# Patient Record
Sex: Female | Born: 1940 | Race: Black or African American | Hispanic: No | Smoking: Former smoker
Health system: Southern US, Community
[De-identification: ages and names within clinical notes are randomized; demographics above are authoritative.]

## PROBLEM LIST (undated history)

## (undated) DIAGNOSIS — I10 Essential (primary) hypertension: Secondary | ICD-10-CM

## (undated) DIAGNOSIS — B019 Varicella without complication: Secondary | ICD-10-CM

## (undated) DIAGNOSIS — F0390 Unspecified dementia without behavioral disturbance: Secondary | ICD-10-CM

## (undated) DIAGNOSIS — R4701 Aphasia: Secondary | ICD-10-CM

## (undated) DIAGNOSIS — R251 Tremor, unspecified: Secondary | ICD-10-CM

## (undated) DIAGNOSIS — M109 Gout, unspecified: Secondary | ICD-10-CM

## (undated) DIAGNOSIS — K219 Gastro-esophageal reflux disease without esophagitis: Secondary | ICD-10-CM

## (undated) DIAGNOSIS — D649 Anemia, unspecified: Secondary | ICD-10-CM

## (undated) DIAGNOSIS — E119 Type 2 diabetes mellitus without complications: Secondary | ICD-10-CM

## (undated) DIAGNOSIS — N184 Chronic kidney disease, stage 4 (severe): Secondary | ICD-10-CM

## (undated) DIAGNOSIS — Z9289 Personal history of other medical treatment: Secondary | ICD-10-CM

## (undated) DIAGNOSIS — E7800 Pure hypercholesterolemia, unspecified: Secondary | ICD-10-CM

## (undated) DIAGNOSIS — M1990 Unspecified osteoarthritis, unspecified site: Secondary | ICD-10-CM

## (undated) HISTORY — PX: CHOLECYSTECTOMY: SHX55

## (undated) HISTORY — PX: APPENDECTOMY: SHX54

## (undated) HISTORY — DX: Varicella without complication: B01.9

---

## 2014-01-31 ENCOUNTER — Emergency Department (HOSPITAL_BASED_OUTPATIENT_CLINIC_OR_DEPARTMENT_OTHER): Payer: Medicare Other

## 2014-01-31 ENCOUNTER — Emergency Department (HOSPITAL_BASED_OUTPATIENT_CLINIC_OR_DEPARTMENT_OTHER)
Admission: EM | Admit: 2014-01-31 | Discharge: 2014-01-31 | Disposition: A | Discharge status: Home / Self Care | Payer: Medicare Other | Attending: Emergency Medicine | Admitting: Emergency Medicine

## 2014-01-31 ENCOUNTER — Encounter (HOSPITAL_BASED_OUTPATIENT_CLINIC_OR_DEPARTMENT_OTHER): Payer: Self-pay | Admitting: Emergency Medicine

## 2014-01-31 DIAGNOSIS — L02619 Cutaneous abscess of unspecified foot: Secondary | ICD-10-CM | POA: Insufficient documentation

## 2014-01-31 DIAGNOSIS — N184 Chronic kidney disease, stage 4 (severe): Secondary | ICD-10-CM | POA: Insufficient documentation

## 2014-01-31 DIAGNOSIS — W19XXXA Unspecified fall, initial encounter: Secondary | ICD-10-CM

## 2014-01-31 DIAGNOSIS — N289 Disorder of kidney and ureter, unspecified: Secondary | ICD-10-CM

## 2014-01-31 DIAGNOSIS — Z794 Long term (current) use of insulin: Secondary | ICD-10-CM | POA: Insufficient documentation

## 2014-01-31 DIAGNOSIS — L03119 Cellulitis of unspecified part of limb: Principal | ICD-10-CM

## 2014-01-31 DIAGNOSIS — E119 Type 2 diabetes mellitus without complications: Secondary | ICD-10-CM | POA: Insufficient documentation

## 2014-01-31 DIAGNOSIS — S0990XA Unspecified injury of head, initial encounter: Secondary | ICD-10-CM | POA: Insufficient documentation

## 2014-01-31 DIAGNOSIS — W1809XA Striking against other object with subsequent fall, initial encounter: Secondary | ICD-10-CM | POA: Insufficient documentation

## 2014-01-31 DIAGNOSIS — Z87891 Personal history of nicotine dependence: Secondary | ICD-10-CM | POA: Insufficient documentation

## 2014-01-31 DIAGNOSIS — Z79899 Other long term (current) drug therapy: Secondary | ICD-10-CM | POA: Insufficient documentation

## 2014-01-31 DIAGNOSIS — Y929 Unspecified place or not applicable: Secondary | ICD-10-CM | POA: Insufficient documentation

## 2014-01-31 DIAGNOSIS — Y939 Activity, unspecified: Secondary | ICD-10-CM | POA: Insufficient documentation

## 2014-01-31 DIAGNOSIS — I129 Hypertensive chronic kidney disease with stage 1 through stage 4 chronic kidney disease, or unspecified chronic kidney disease: Secondary | ICD-10-CM | POA: Insufficient documentation

## 2014-01-31 DIAGNOSIS — L03115 Cellulitis of right lower limb: Secondary | ICD-10-CM

## 2014-01-31 HISTORY — DX: Tremor, unspecified: R25.1

## 2014-01-31 HISTORY — DX: Gout, unspecified: M10.9

## 2014-01-31 HISTORY — DX: Essential (primary) hypertension: I10

## 2014-01-31 LAB — COMPREHENSIVE METABOLIC PANEL
ALT: 75 U/L — AB (ref 0–35)
AST: 35 U/L (ref 0–37)
Albumin: 3.6 g/dL (ref 3.5–5.2)
Alkaline Phosphatase: 152 U/L — ABNORMAL HIGH (ref 39–117)
BUN: 29 mg/dL — ABNORMAL HIGH (ref 6–23)
CALCIUM: 10.9 mg/dL — AB (ref 8.4–10.5)
CO2: 26 mEq/L (ref 19–32)
CREATININE: 1.6 mg/dL — AB (ref 0.50–1.10)
Chloride: 98 mEq/L (ref 96–112)
GFR calc Af Amer: 36 mL/min — ABNORMAL LOW (ref >90)
GFR calc non Af Amer: 31 mL/min — ABNORMAL LOW (ref >90)
Glucose, Bld: 154 mg/dL — ABNORMAL HIGH (ref 70–99)
Potassium: 3.9 mEq/L (ref 3.7–5.3)
SODIUM: 137 meq/L (ref 137–147)
Total Bilirubin: 0.6 mg/dL (ref 0.3–1.2)
Total Protein: 8.5 g/dL — ABNORMAL HIGH (ref 6.0–8.3)

## 2014-01-31 LAB — CBC
HCT: 31.2 % — ABNORMAL LOW (ref 36.0–46.0)
Hemoglobin: 10.1 g/dL — ABNORMAL LOW (ref 12.0–15.0)
MCH: 30.3 pg (ref 26.0–34.0)
MCHC: 32.4 g/dL (ref 30.0–36.0)
MCV: 93.7 fL (ref 78.0–100.0)
Platelets: 237 10*3/uL (ref 150–400)
RBC: 3.33 MIL/uL — ABNORMAL LOW (ref 3.87–5.11)
RDW: 13.1 % (ref 11.5–15.5)
WBC: 12.9 10*3/uL — AB (ref 4.0–10.5)

## 2014-01-31 LAB — URINE MICROSCOPIC-ADD ON

## 2014-01-31 LAB — GLUCOSE, CAPILLARY: GLUCOSE-CAPILLARY: 160 mg/dL — AB (ref 70–99)

## 2014-01-31 LAB — URINALYSIS, ROUTINE W REFLEX MICROSCOPIC
Bilirubin Urine: NEGATIVE
GLUCOSE, UA: NEGATIVE mg/dL
Ketones, ur: NEGATIVE mg/dL
Nitrite: NEGATIVE
Protein, ur: 100 mg/dL — AB
Specific Gravity, Urine: 1.011 (ref 1.005–1.030)
Urobilinogen, UA: 1 mg/dL (ref 0.0–1.0)
pH: 7 (ref 5.0–8.0)

## 2014-01-31 MED ORDER — CLINDAMYCIN PHOSPHATE 600 MG/50ML IV SOLN
600.0000 mg | Freq: Once | INTRAVENOUS | Status: AC
  Administered 2014-01-31: 600 mg via INTRAVENOUS
  Filled 2014-01-31: qty 50

## 2014-01-31 MED ORDER — OXYCODONE-ACETAMINOPHEN 5-325 MG PO TABS: 1.0000 | ORAL_TABLET | Freq: Four times a day (QID) | ORAL | Status: DC | PRN

## 2014-01-31 MED ORDER — CLINDAMYCIN HCL 300 MG PO CAPS: 300.0000 mg | ORAL_CAPSULE | Freq: Four times a day (QID) | ORAL | Status: DC

## 2014-01-31 MED ORDER — MORPHINE SULFATE 4 MG/ML IJ SOLN
4.0000 mg | Freq: Once | INTRAMUSCULAR | Status: AC
  Administered 2014-01-31: 4 mg via INTRAVENOUS
  Filled 2014-01-31: qty 1

## 2014-01-31 MED ORDER — ONDANSETRON HCL 4 MG/2ML IJ SOLN
4.0000 mg | Freq: Once | INTRAMUSCULAR | Status: AC
  Administered 2014-01-31: 4 mg via INTRAVENOUS
  Filled 2014-01-31: qty 2

## 2014-01-31 NOTE — ED Notes (Addendum)
Swelling to bilat feet and legs x 6 days-also c/o of fall approx 2 weeks ago and concerned b/c she has HA-report from son and daughter in law-[facility] stay with family

## 2014-01-31 NOTE — ED Notes (Signed)
MD at bedside discussing test results and dispo plan of care. 

## 2014-01-31 NOTE — Discharge Instructions (Signed)
Return to the ED with any concerns including fever/chills, increased area of redness or swelling, vomiting and not able to keep down antibiotics, difficulty breathing, fainting, decreased level of alertness/lethargy, or any other alarming symptoms

## 2014-01-31 NOTE — ED Provider Notes (Signed)
CSN: [id number]     Arrival date & time 01/31/14  1523 History   First MD Initiated Contact with Patient 01/31/14 1529     Chief Complaint  Patient presents with  . Leg Swelling     (Consider location/radiation/quality/duration/timing/severity/associated sxs/prior Treatment) HPI Pt presents with multiple complaints.  Per family they have just come from [place] after picking her up to come live with them.  She had a fall approx 1 week ago, hit her head and is c/o diffuse headache.  Also c/o pain in left wrist and her anterior chest wall after the fall.  Over the past 3-4 days she has had swelling and redness of her right foot and lower leg.  Family states she was in a rehab facility in [place] and they were told she had stage 4 kidney disease.  They do state that since their arrival in [place] the swelling and redness in right leg only have been worsening.  No fever/chills, no vomiting.  No chest pain or shortness of breath.  Pt endorses feeling generally weak.  There are no other associated systemic symptoms, there are no other alleviating or modifying factors.   Past Medical History  Diagnosis Date  . Diabetes mellitus without complication   . Renal failure, chronic   . Gout   . Hypertension   . Tremors of nervous system    Past Surgical History  Procedure Laterality Date  . Cholecystectomy    . Appendectomy     No family history on file. History  Substance Use Topics  . Smoking status: Former Games developer  . Smokeless tobacco: Not on file  . Alcohol Use: Yes   OB History   Grav Para Term Preterm Abortions TAB SAB Ect Mult Living                 Review of Systems ROS reviewed and all otherwise negative except for mentioned in HPI    Allergies  Review of patient's allergies indicates no known allergies.  Home Medications   Current Outpatient Rx  Name  Route  Sig  Dispense  Refill  . amLODipine (NORVASC) 5 MG tablet   Oral   Take 5 mg by mouth daily.         . clindamycin  (CLEOCIN) 300 MG capsule   Oral   Take 1 capsule (300 mg total) by mouth every 6 (six) hours.   42 capsule   0   . enoxaparin (LOVENOX) 40 MG/0.4ML injection   Subcutaneous   Inject 40 mg into the skin daily.         . ferrous sulfate 325 (65 FE) MG tablet   Oral   Take 325 mg by mouth 2 (two) times daily with a meal.         . HYDROcodone-acetaminophen (NORCO/VICODIN) 5-325 MG per tablet   Oral   Take 1 tablet by mouth every 6 (six) hours as needed for moderate pain.         Marland Kitchen insulin detemir (LEVEMIR) 100 UNIT/ML injection   Subcutaneous   Inject 25 Units into the skin at bedtime.         . meclizine (ANTIVERT) 25 MG tablet   Oral   Take 25 mg by mouth 3 (three) times daily as needed for dizziness.         Marland Kitchen omeprazole (PRILOSEC) 40 MG capsule   Oral   Take 40 mg by mouth daily.         Marland Kitchen oxyCODONE-acetaminophen (PERCOCET/ROXICET)  5-325 MG per tablet   Oral   Take 1-2 tablets by mouth every 6 (six) hours as needed for severe pain.   15 tablet   0   . pioglitazone (ACTOS) 45 MG tablet   Oral   Take 45 mg by mouth daily.         . primidone (MYSOLINE) 50 MG tablet   Oral   Take 25 mg by mouth at bedtime.         . simvastatin (ZOCOR) 40 MG tablet   Oral   Take 40 mg by mouth daily.         . sodium bicarbonate 650 MG tablet   Oral   Take 650 mg by mouth 2 (two) times daily.          BP 173/69  Pulse 79  Temp(Src) 99 F (37.2 C) (Oral)  Resp 16  SpO2 93% Vitals reviewed Physical Exam Physical Examination: General appearance - alert, well appearing, and in no distress Mental status - alert, oriented to person, place, and time Head- NCAT Eyes - pupils equal and reactive, extraocular eye movements intact Mouth - mucous membranes moist, pharynx normal without lesions Chest - clear to auscultation, no wheezes, rales or rhonchi, symmetric air entry, ttp over lateral ribs bilaterally, no crepitus Heart - normal rate, regular rhythm,  normal S1, S2, no murmurs, rubs, clicks or gallops Abdomen - soft, nontender, nondistended, no masses or organomegaly Neurological - alert, oriented, normal speech, strength 5/5 in extremities x 4, sensation intact MS- ttp over left wrist, no pain with ROM of other extremities/joints Extremities - peripheral pulses normal, no clubbing or cyanosis, right lower extremity with erythema and swelling of dorsum of right foot and ankle and lower tibial region Skin - normal coloration and turgor, no rashes- other than as noted above in extremities  ED Course  Procedures (including critical care time) Labs Review Labs Reviewed  GLUCOSE, CAPILLARY - Abnormal; Notable for the following:    Glucose-Capillary 160 (*)    All other components within normal limits  CBC - Abnormal; Notable for the following:    WBC 12.9 (*)    RBC 3.33 (*)    Hemoglobin 10.1 (*)    HCT 31.2 (*)    All other components within normal limits  COMPREHENSIVE METABOLIC PANEL - Abnormal; Notable for the following:    Glucose, Bld 154 (*)    BUN 29 (*)    Creatinine, Ser 1.60 (*)    Calcium 10.9 (*)    Total Protein 8.5 (*)    ALT 75 (*)    Alkaline Phosphatase 152 (*)    GFR calc non Af Amer 31 (*)    GFR calc Af Amer 36 (*)    All other components within normal limits  URINALYSIS, ROUTINE W REFLEX MICROSCOPIC - Abnormal; Notable for the following:    Hgb urine dipstick TRACE (*)    Protein, ur 100 (*)    Leukocytes, UA TRACE (*)    All other components within normal limits  URINE MICROSCOPIC-ADD ON   Imaging Review Dg Chest 2 View  01/31/2014   CLINICAL DATA:  Bilateral foot swelling and leg swelling for 6 days, fell 2 weeks ago  EXAM: CHEST  2 VIEW  COMPARISON:  None.  FINDINGS: Heart size is upper normal. Vascular pattern is normal. Lungs are clear. No effusion or or pneumothorax. Bony thorax intact.  IMPRESSION: No acute findings   Electronically Signed   By: Edgar Frisk.D.  On: 01/31/2014 17:08   Dg  Wrist Complete Left  01/31/2014   CLINICAL DATA:  Larey SeatFell 2 weeks ago with wrist pain  EXAM: LEFT WRIST - COMPLETE 3+ VIEW  COMPARISON:  None.  FINDINGS: Old fracture ulnar styloid process, corticated and mildly displaced. Triangular fibrocartilage calcification noted. No evidence of acute fracture or dislocation.  IMPRESSION: No acute findings   Electronically Signed   By: Esperanza Heiraymond  Rubner M.D.   On: 01/31/2014 17:07   Ct Head Wo Contrast  01/31/2014   CLINICAL DATA:  Persistent headaches after falling 2 weeks ago.  EXAM: CT HEAD WITHOUT CONTRAST  TECHNIQUE: Contiguous axial images were obtained from the base of the skull through the vertex without intravenous contrast.  COMPARISON:  None.  FINDINGS: There is no evidence of acute intracranial hemorrhage, mass lesion, brain edema or extra-axial fluid collection. The ventricles and subarachnoid spaces are appropriately sized for age. There is no CT evidence of acute cortical infarction. Mild intracranial vascular calcifications are noted.  The visualized paranasal sinuses, mastoid air cells and middle ears are clear. The calvarium is intact.  IMPRESSION: Unremarkable noncontrast head CT for age.   Electronically Signed   By: Roxy HorsemanBill  Veazey M.D.   On: 01/31/2014 17:10   [place] Venous Img Lower Unilateral Right  01/31/2014   CLINICAL DATA:  Right lower leg swelling. Fell 6 days ago. Patient unable to provide months history at this time.  EXAM: RIGHT LOWER EXTREMITY VENOUS DOPPLER ULTRASOUND  TECHNIQUE: Gray-scale sonography with graded compression, as well as color Doppler and duplex ultrasound, were performed to evaluate the deep venous system from the level of the common femoral vein through the popliteal and proximal calf veins. Spectral Doppler was utilized to evaluate flow at rest and with distal augmentation maneuvers.  COMPARISON:  None.  FINDINGS: Thrombus within deep veins:  None visualized.  Compressibility of deep veins:  Normal.  Duplex waveform respiratory  phasicity:  Normal.  Duplex waveform response to augmentation:  Normal.  Venous reflux:  None visualized.  The peroneal vein is not well visualized.  Other findings: Subcutaneous edema is noted in the mid to lower calf region and ankle.  IMPRESSION: 1. No evidence of right lower extremity deep venous thrombosis. 2. Subcutaneous edema of the right lower extremity at the level of the calf and ankle.   Electronically Signed   By: Britta MccreedySusan  Turner M.D.   On: 01/31/2014 16:55    EKG Interpretation   None       MDM   Final diagnoses:  Fall  Minor head injury  Cellulitis of right foot  Renal insufficiency    Pt presenting with numerous complaints, family has just brought her down to [place] to live with them.  Pt found to have renal insufficiency which family is aware of- requested information for [facility] to arrange followup. Head CT and xrays are reassuring.  Venous duplex of RLE shows no blood clot. Pt does have some elevation in WBC- started on clindamycin IV in the ED.  Will discharge with po medications.  Given information for local PMDs to contact.  Discharged with strict return precautions.  Pt agreeable with plan.    Ethelda ChickMartha K Linker, MD 01/31/14 [place]ina [id number]

## 2014-01-31 NOTE — ED Notes (Signed)
[facility]

## 2014-02-02 ENCOUNTER — Ambulatory Visit (INDEPENDENT_AMBULATORY_CARE_PROVIDER_SITE_OTHER): Payer: Medicare Other | Admitting: Family Medicine

## 2014-02-02 ENCOUNTER — Encounter: Payer: Self-pay | Admitting: Family Medicine

## 2014-02-02 ENCOUNTER — Ambulatory Visit (HOSPITAL_BASED_OUTPATIENT_CLINIC_OR_DEPARTMENT_OTHER)
Admission: RE | Admit: 2014-02-02 | Discharge: 2014-02-02 | Disposition: A | Discharge status: Home / Self Care | Payer: Medicare Other | Source: Ambulatory Visit | Attending: Family Medicine | Admitting: Family Medicine

## 2014-02-02 VITALS — BP 183/82 | HR 68 | Ht 64.0 in | Wt 183.0 lb

## 2014-02-02 DIAGNOSIS — W19XXXA Unspecified fall, initial encounter: Secondary | ICD-10-CM | POA: Insufficient documentation

## 2014-02-02 DIAGNOSIS — M25473 Effusion, unspecified ankle: Secondary | ICD-10-CM | POA: Insufficient documentation

## 2014-02-02 DIAGNOSIS — S8990XA Unspecified injury of unspecified lower leg, initial encounter: Secondary | ICD-10-CM

## 2014-02-02 DIAGNOSIS — S299XXA Unspecified injury of thorax, initial encounter: Secondary | ICD-10-CM

## 2014-02-02 DIAGNOSIS — M549 Dorsalgia, unspecified: Secondary | ICD-10-CM

## 2014-02-02 DIAGNOSIS — S6990XA Unspecified injury of unspecified wrist, hand and finger(s), initial encounter: Secondary | ICD-10-CM

## 2014-02-02 DIAGNOSIS — S6992XA Unspecified injury of left wrist, hand and finger(s), initial encounter: Secondary | ICD-10-CM

## 2014-02-02 DIAGNOSIS — M25579 Pain in unspecified ankle and joints of unspecified foot: Secondary | ICD-10-CM | POA: Insufficient documentation

## 2014-02-02 DIAGNOSIS — N184 Chronic kidney disease, stage 4 (severe): Secondary | ICD-10-CM

## 2014-02-02 DIAGNOSIS — M25571 Pain in right ankle and joints of right foot: Secondary | ICD-10-CM

## 2014-02-02 DIAGNOSIS — S99919A Unspecified injury of unspecified ankle, initial encounter: Secondary | ICD-10-CM

## 2014-02-02 DIAGNOSIS — S99929A Unspecified injury of unspecified foot, initial encounter: Secondary | ICD-10-CM

## 2014-02-02 DIAGNOSIS — S99911A Unspecified injury of right ankle, initial encounter: Secondary | ICD-10-CM

## 2014-02-02 DIAGNOSIS — M25476 Effusion, unspecified foot: Secondary | ICD-10-CM | POA: Insufficient documentation

## 2014-02-02 DIAGNOSIS — S2341XA Sprain of ribs, initial encounter: Secondary | ICD-10-CM

## 2014-02-02 DIAGNOSIS — S59909A Unspecified injury of unspecified elbow, initial encounter: Secondary | ICD-10-CM

## 2014-02-02 DIAGNOSIS — S59919A Unspecified injury of unspecified forearm, initial encounter: Secondary | ICD-10-CM

## 2014-02-02 MED ORDER — OXYCODONE-ACETAMINOPHEN 5-325 MG PO TABS: 1.0000 | ORAL_TABLET | Freq: Four times a day (QID) | ORAL | Status: AC | PRN

## 2014-02-02 NOTE — Patient Instructions (Addendum)
Establish care with a primary care physician - call [facility] to do so and make sure you let them know you're following up from a hospitalization. Your imaging has been very reassuring but we will do ankle x-rays. The diagnosis of 'aftercare from traumatic fracture' may be old from the nursing facility but the ankle x-rays would be the last place that we haven't imaged where a new fracture could be. Take percocet as needed for severe pain. We will refer you to the kidney specialists. The ribs, head, wrist injuries will heal with time. Ribs can take up to 6-8 weeks to feel completely better. Can ice 15 minutes at a time as needed. Wrist brace is a consideration but given how much you've improved I don't think this is necessary. Follow up with me in 2 weeks for reevaluation. We will consider physical therapy for your ankle, reevaluate other areas.

## 2014-02-08 ENCOUNTER — Encounter (HOSPITAL_COMMUNITY): Payer: Self-pay | Admitting: Internal Medicine

## 2014-02-08 ENCOUNTER — Inpatient Hospital Stay (HOSPITAL_COMMUNITY): Payer: Medicare Other

## 2014-02-08 ENCOUNTER — Inpatient Hospital Stay (HOSPITAL_COMMUNITY)
Admission: AD | Admit: 2014-02-08 | Discharge: 2014-02-13 | DRG: 682 | Disposition: A | Discharge status: Skilled Nursing Facility | Payer: Medicare Other | Source: Ambulatory Visit | Attending: Internal Medicine | Admitting: Internal Medicine

## 2014-02-08 ENCOUNTER — Ambulatory Visit (INDEPENDENT_AMBULATORY_CARE_PROVIDER_SITE_OTHER): Payer: Medicare Other | Admitting: Physician Assistant

## 2014-02-08 ENCOUNTER — Encounter: Payer: Self-pay | Admitting: Physician Assistant

## 2014-02-08 VITALS — BP 168/78 | HR 66 | Temp 97.8°F | Resp 14 | Ht 64.0 in | Wt 183.0 lb

## 2014-02-08 DIAGNOSIS — E210 Primary hyperparathyroidism: Secondary | ICD-10-CM | POA: Diagnosis present

## 2014-02-08 DIAGNOSIS — Z794 Long term (current) use of insulin: Secondary | ICD-10-CM

## 2014-02-08 DIAGNOSIS — Y92009 Unspecified place in unspecified non-institutional (private) residence as the place of occurrence of the external cause: Secondary | ICD-10-CM

## 2014-02-08 DIAGNOSIS — E860 Dehydration: Secondary | ICD-10-CM

## 2014-02-08 DIAGNOSIS — S8263XA Displaced fracture of lateral malleolus of unspecified fibula, initial encounter for closed fracture: Secondary | ICD-10-CM

## 2014-02-08 DIAGNOSIS — I129 Hypertensive chronic kidney disease with stage 1 through stage 4 chronic kidney disease, or unspecified chronic kidney disease: Secondary | ICD-10-CM | POA: Diagnosis present

## 2014-02-08 DIAGNOSIS — M109 Gout, unspecified: Secondary | ICD-10-CM | POA: Diagnosis present

## 2014-02-08 DIAGNOSIS — G9340 Encephalopathy, unspecified: Secondary | ICD-10-CM

## 2014-02-08 DIAGNOSIS — I10 Essential (primary) hypertension: Secondary | ICD-10-CM

## 2014-02-08 DIAGNOSIS — R4701 Aphasia: Secondary | ICD-10-CM | POA: Diagnosis present

## 2014-02-08 DIAGNOSIS — R5381 Other malaise: Secondary | ICD-10-CM | POA: Diagnosis present

## 2014-02-08 DIAGNOSIS — R4182 Altered mental status, unspecified: Secondary | ICD-10-CM

## 2014-02-08 DIAGNOSIS — W19XXXA Unspecified fall, initial encounter: Secondary | ICD-10-CM | POA: Diagnosis present

## 2014-02-08 DIAGNOSIS — R339 Retention of urine, unspecified: Secondary | ICD-10-CM | POA: Diagnosis present

## 2014-02-08 DIAGNOSIS — E119 Type 2 diabetes mellitus without complications: Secondary | ICD-10-CM

## 2014-02-08 DIAGNOSIS — Z79899 Other long term (current) drug therapy: Secondary | ICD-10-CM

## 2014-02-08 DIAGNOSIS — N183 Chronic kidney disease, stage 3 unspecified: Secondary | ICD-10-CM | POA: Diagnosis present

## 2014-02-08 DIAGNOSIS — N179 Acute kidney failure, unspecified: Principal | ICD-10-CM | POA: Diagnosis present

## 2014-02-08 DIAGNOSIS — Z87891 Personal history of nicotine dependence: Secondary | ICD-10-CM

## 2014-02-08 DIAGNOSIS — N189 Chronic kidney disease, unspecified: Secondary | ICD-10-CM

## 2014-02-08 DIAGNOSIS — G9349 Other encephalopathy: Secondary | ICD-10-CM | POA: Diagnosis present

## 2014-02-08 DIAGNOSIS — F05 Delirium due to known physiological condition: Secondary | ICD-10-CM

## 2014-02-08 HISTORY — DX: Aphasia: R47.01

## 2014-02-08 HISTORY — DX: Unspecified dementia, unspecified severity, without behavioral disturbance, psychotic disturbance, mood disturbance, and anxiety: F03.90

## 2014-02-08 HISTORY — DX: Personal history of other medical treatment: Z92.89

## 2014-02-08 HISTORY — DX: Anemia, unspecified: D64.9

## 2014-02-08 HISTORY — DX: Unspecified osteoarthritis, unspecified site: M19.90

## 2014-02-08 HISTORY — DX: Pure hypercholesterolemia, unspecified: E78.00

## 2014-02-08 HISTORY — DX: Gastro-esophageal reflux disease without esophagitis: K21.9

## 2014-02-08 HISTORY — DX: Type 2 diabetes mellitus without complications: E11.9

## 2014-02-08 HISTORY — DX: Chronic kidney disease, stage 4 (severe): N18.4

## 2014-02-08 LAB — COMPREHENSIVE METABOLIC PANEL
ALT: 43 U/L — ABNORMAL HIGH (ref 0–35)
AST: 33 U/L (ref 0–37)
Albumin: 3.3 g/dL — ABNORMAL LOW (ref 3.5–5.2)
Alkaline Phosphatase: 148 U/L — ABNORMAL HIGH (ref 39–117)
BUN: 26 mg/dL — ABNORMAL HIGH (ref 6–23)
CALCIUM: 10.9 mg/dL — AB (ref 8.4–10.5)
CO2: 26 meq/L (ref 19–32)
CREATININE: 1.73 mg/dL — AB (ref 0.50–1.10)
Chloride: 102 mEq/L (ref 96–112)
GFR calc Af Amer: 32 mL/min — ABNORMAL LOW (ref >90)
GFR, EST NON AFRICAN AMERICAN: 28 mL/min — AB (ref >90)
Glucose, Bld: 121 mg/dL — ABNORMAL HIGH (ref 70–99)
Potassium: 4.9 mEq/L (ref 3.7–5.3)
Sodium: 142 mEq/L (ref 137–147)
Total Bilirubin: 0.3 mg/dL (ref 0.3–1.2)
Total Protein: 8.3 g/dL (ref 6.0–8.3)

## 2014-02-08 LAB — GLUCOSE, CAPILLARY
Glucose-Capillary: 142 mg/dL — ABNORMAL HIGH (ref 70–99)
Glucose-Capillary: 113 mg/dL — ABNORMAL HIGH (ref 70–99)

## 2014-02-08 LAB — CBC
HCT: 29.2 % — ABNORMAL LOW (ref 36.0–46.0)
Hemoglobin: 9.6 g/dL — ABNORMAL LOW (ref 12.0–15.0)
MCH: 29.8 pg (ref 26.0–34.0)
MCHC: 32.9 g/dL (ref 30.0–36.0)
MCV: 90.7 fL (ref 78.0–100.0)
Platelets: 482 10*3/uL — ABNORMAL HIGH (ref 150–400)
RBC: 3.22 MIL/uL — AB (ref 3.87–5.11)
RDW: 13.3 % (ref 11.5–15.5)
WBC: 10.8 10*3/uL — ABNORMAL HIGH (ref 4.0–10.5)

## 2014-02-08 LAB — PHOSPHORUS: PHOSPHORUS: 2.7 mg/dL (ref 2.3–4.6)

## 2014-02-08 LAB — MAGNESIUM: Magnesium: 1.8 mg/dL (ref 1.5–2.5)

## 2014-02-08 MED ORDER — SODIUM CHLORIDE 0.9 % IV SOLN
INTRAVENOUS | Status: DC
  Administered 2014-02-08: 20 mL/h via INTRAVENOUS

## 2014-02-08 MED ORDER — INSULIN ASPART 100 UNIT/ML ~~LOC~~ SOLN
0.0000 [IU] | Freq: Three times a day (TID) | SUBCUTANEOUS | Status: DC
  Administered 2014-02-09 (×3): 1 [IU] via SUBCUTANEOUS

## 2014-02-08 MED ORDER — HEPARIN SODIUM (PORCINE) 5000 UNIT/ML IJ SOLN: 5000.0000 [IU] | Freq: Three times a day (TID) | INTRAMUSCULAR | Status: DC

## 2014-02-08 MED ORDER — PANTOPRAZOLE SODIUM 40 MG IV SOLR
40.0000 mg | Freq: Once | INTRAVENOUS | Status: AC
  Administered 2014-02-08: 40 mg via INTRAVENOUS
  Filled 2014-02-08 (×2): qty 40

## 2014-02-08 MED ORDER — PANTOPRAZOLE SODIUM 40 MG PO TBEC: 40.0000 mg | DELAYED_RELEASE_TABLET | Freq: Every day | ORAL | Status: DC

## 2014-02-08 MED ORDER — ACETAMINOPHEN 325 MG PO TABS
650.0000 mg | ORAL_TABLET | Freq: Four times a day (QID) | ORAL | Status: DC | PRN
  Administered 2014-02-10 – 2014-02-11 (×3): 650 mg via ORAL
  Filled 2014-02-08 (×4): qty 2

## 2014-02-08 MED ORDER — INSULIN DETEMIR 100 UNIT/ML ~~LOC~~ SOLN
10.0000 [IU] | Freq: Every day | SUBCUTANEOUS | Status: DC
  Filled 2014-02-08 (×2): qty 0.1

## 2014-02-08 MED ORDER — ONDANSETRON HCL 4 MG/2ML IJ SOLN
4.0000 mg | Freq: Four times a day (QID) | INTRAMUSCULAR | Status: DC | PRN
  Administered 2014-02-11 (×2): 4 mg via INTRAVENOUS
  Filled 2014-02-08 (×2): qty 2

## 2014-02-08 MED ORDER — SODIUM CHLORIDE 0.9 % IJ SOLN
3.0000 mL | Freq: Two times a day (BID) | INTRAMUSCULAR | Status: DC
  Administered 2014-02-09 – 2014-02-12 (×2): 3 mL via INTRAVENOUS

## 2014-02-08 MED ORDER — SODIUM BICARBONATE 650 MG PO TABS
650.0000 mg | ORAL_TABLET | Freq: Two times a day (BID) | ORAL | Status: DC
  Administered 2014-02-10 – 2014-02-13 (×7): 650 mg via ORAL
  Filled 2014-02-08 (×12): qty 1

## 2014-02-08 MED ORDER — ONDANSETRON HCL 4 MG PO TABS: 4.0000 mg | ORAL_TABLET | Freq: Four times a day (QID) | ORAL | Status: DC | PRN

## 2014-02-08 MED ORDER — ACETAMINOPHEN 650 MG RE SUPP
650.0000 mg | Freq: Four times a day (QID) | RECTAL | Status: DC | PRN
  Administered 2014-02-09: 650 mg via RECTAL
  Filled 2014-02-08: qty 1

## 2014-02-08 MED ORDER — SIMVASTATIN 40 MG PO TABS: 40.0000 mg | ORAL_TABLET | Freq: Every day | ORAL | Status: DC

## 2014-02-08 MED ORDER — ATORVASTATIN CALCIUM 20 MG PO TABS
20.0000 mg | ORAL_TABLET | Freq: Every day | ORAL | Status: DC
  Administered 2014-02-10 – 2014-02-13 (×3): 20 mg via ORAL
  Filled 2014-02-08 (×5): qty 1

## 2014-02-08 MED ORDER — AMLODIPINE BESYLATE 5 MG PO TABS
5.0000 mg | ORAL_TABLET | Freq: Every day | ORAL | Status: DC
  Filled 2014-02-08: qty 1

## 2014-02-08 MED ORDER — ENOXAPARIN SODIUM 40 MG/0.4ML ~~LOC~~ SOLN
40.0000 mg | SUBCUTANEOUS | Status: DC
Start: 2014-02-08 — End: 2014-02-11
  Administered 2014-02-08 – 2014-02-10 (×3): 40 mg via SUBCUTANEOUS
  Filled 2014-02-08 (×4): qty 0.4

## 2014-02-08 NOTE — H&P (Addendum)
[facility] History and Physical  Karen Mcknight TGG:[id number] DOB: 13-Sep-1941 DOA: 02/08/2014  Referring physician: PCP PCP: Pcp Not In System   Chief Complaint: confusion  HPI: Karen Mcknight is a 73 y.o. female  With past medical history of diabetes hypertension chronic kidney disease  who presents to the PCP's office with a two-week history of worsening confusion expressive aphasia. The patient was admitted to [facility] about a month prior to admission for fall which she spent 6-8 hours at that time on the floor when she was transported to the ED and admitted over there and as per son could not find anything wrong with her. She sent to SNF. As she was not improving he decided to move her [place] 2 week prior to admission. Brought to the ED 1 week prior to admission and discharge home with a diagnosis of cellulitis of her right lower extremity. She then follow up with  sports medicine and x-ray of her foot was done which showed a fracture according to her son.Saw her PCP on the day of admission and he found it to be aphasic with tremors and unable to ambulate or comunicate. Her son relates that she's been more and more confused and  lower extremity weakness and her aphasia is new.   Review of Systems:  Constitutional:  No weight loss, night sweats, Fevers, chills, fatigue.  HEENT:  No headaches, Difficulty swallowing,Tooth/dental problems,Sore throat,  No sneezing, itching, ear ache, nasal congestion, post nasal drip,  Cardio-vascular:  No chest pain, Orthopnea, PND, swelling in lower extremities, anasarca, dizziness, palpitations  GI:  No heartburn, indigestion, abdominal pain, nausea, vomiting, diarrhea, change in bowel habits, loss of appetite  Resp:  No shortness of breath with exertion or at rest. No excess mucus, no productive cough, No non-productive cough, No coughing up of blood.No change in color of mucus.No wheezing.No chest wall deformity  Skin:  no rash or  lesions.  GU:  no dysuria, change in color of urine, no urgency or frequency. No flank pain.  Musculoskeletal:  No joint pain or swelling. No decreased range of motion. No back pain.    Past Medical History  Diagnosis Date  . Diabetes mellitus without complication   . Renal failure, chronic   . Gout   . Hypertension   . Tremors of nervous system   . Chicken pox    Past Surgical History  Procedure Laterality Date  . Cholecystectomy    . Appendectomy     Social History:  reports that she has quit smoking. Her smoking use included Cigarettes. She smoked 0.00 packs per day. She does not have any smokeless tobacco history on file. She reports that she drinks alcohol. Her drug history is not on file.  No Known Allergies  Family History  Problem Relation Age of Onset  . Diabetes Mother 14    Deceased  . Alzheimer's disease Mother   . Diabetes Brother     x2  . Cancer Sister   . Diabetes Sister   . Hypertension Sister   . Hyperlipidemia Son   . Hypertension Son     #2  . Heart disease Son     #2     Prior to Admission medications   Medication Sig Start Date End Date Taking? Authorizing Provider  amLODipine (NORVASC) 5 MG tablet Take 5 mg by mouth daily.   Yes Historical Provider, MD  clindamycin (CLEOCIN) 300 MG capsule Take 1 capsule (300 mg total) by mouth every 6 (six) hours.  01/31/14  Yes Martha K Linker, MD  enoxaparin (LOVENOX) 40 MG/0.4ML injection Inject 40 mg into the skin daily.    Historical Provider, MD  ferrous sulfate 325 (65 FE) MG tablet Take 325 mg by mouth 2 (two) times daily with a meal.    Historical Provider, MD  insulin detemir (LEVEMIR) 100 UNIT/ML injection Inject 25 Units into the skin at bedtime.    Historical Provider, MD  meclizine (ANTIVERT) 25 MG tablet Take 25 mg by mouth 3 (three) times daily as needed for dizziness.    Historical Provider, MD  omeprazole (PRILOSEC) 40 MG capsule Take 40 mg by mouth daily.    Historical Provider, MD    oxyCODONE-acetaminophen (PERCOCET/ROXICET) 5-325 MG per tablet Take 1 tablet by mouth every 6 (six) hours as needed for severe pain. 02/02/14   Shane R Hudnall, MD  pioglitazone (ACTOS) 45 MG tablet Take 45 mg by mouth daily.    Historical Provider, MD  primidone (MYSOLINE) 50 MG tablet Take 25 mg by mouth at bedtime.    Historical Provider, MD  simvastatin (ZOCOR) 40 MG tablet Take 40 mg by mouth daily.    Historical Provider, MD  sodium bicarbonate 650 MG tablet Take 650 mg by mouth 2 (two) times daily.    Historical Provider, MD   Physical Exam: Filed Vitals:   02/08/14 1735  BP: 159/72  Pulse: 69  Temp: 98.2 F (36.8 C)  Resp: 14    BP 159/72  Pulse 69  Temp(Src) 98.2 F (36.8 C) (Oral)  Resp 14  Ht 5' 4" (1.626 m)  Wt 79.788 kg (175 lb 14.4 oz)  BMI 30.18 kg/m2  SpO2 98%  General:  Appears calm and comfortable Eyes: PERRL, normal lids, irises & conjunctiva ENT: grossly normal hearing, lips & tongue Neck: no LAD, masses or thyromegaly Cardiovascular: RRR, no m/r/g. No LE edema. Respiratory: CTA bilaterally, no w/r/r. Normal respiratory effort. Abdomen: soft, ntnd Skin: no rash or induration seen on limited exam Musculoskeletal: grossly normal tone BUE/BLE Psychiatric: deppresed mood and affect, speech is slow. Neurologic: The patient knows she is in the hospital, she doesn't know she is in [place], has difficulty answering questions and when she does she has slow and delayed answer, 3-12 are grossly intact sensation seems to be intact although with slight decrease in the lower strategies bilaterally. Muscle strength is 5 out of 5 all 4 extremities. Deep tendon reflexes are 2 bilaterally.           Labs on Admission:  Basic Metabolic Panel: No results found for this basename: NA, K, CL, CO2, GLUCOSE, BUN, CREATININE, CALCIUM, MG, PHOS,  in the last 168 hours Liver Function Tests: No results found for this basename: AST, ALT, ALKPHOS, BILITOT, PROT,  ALBUMIN,  in the last 168 hours No results found for this basename: LIPASE, AMYLASE,  in the last 168 hours No results found for this basename: AMMONIA,  in the last 168 hours CBC: No results found for this basename: WBC, NEUTROABS, HGB, HCT, MCV, PLT,  in the last 168 hours Cardiac Enzymes: No results found for this basename: CKTOTAL, CKMB, CKMBINDEX, TROPONINI,  in the last 168 hours  BNP (last 3 results) No results found for this basename: PROBNP,  in the last 8760 hours CBG:  Recent Labs Lab 02/08/14 1723  GLUCAP 142*    Radiological Exams on Admission: No results found.  EKG: Independently reviewed. pending  Assessment/Plan Acute confusional state - Unclear etiology, as per son he relates   she was itself a month ago. I am concerned that she might have had a stroke vs dementia. So get an MRI of her head.  - Swallowing evaluation - Check TSH, RBC folate, B12 and RPR. EKG. - Check left foot xray. Previous U/A does not show signs of infection. - Check CXR rule out PNA and rib fracture. Check CBC and complete metabolic panel.  Chronic kidney disease - Unknown baseline - Check b-met. - Not on ACE-I.  Essential hypertension, benign - Cont Norvasc. - Borderline low.  Type II or unspecified type diabetes mellitus without mention of complication, not stated as uncontrolled - D/c oral medication cont Levemir and start SSI. - check HbgA1c.  Code Status: full Family Communication: son Disposition Plan: inpatient  Time spent: 80 minutes  FELIZ ORTIZ, ABRAHAM [facility] Pager [phone]  

## 2014-02-08 NOTE — Progress Notes (Signed)
Patient presents to clinic today to establish care.  Patient has recently moved from [place] to live with her family after sustaining a fall at home 2 weeks ago with LOC ~ 4 hours.  Patient's family has multiple concerns about the patient's health.  Patient has history of Type II DM, Stage 4 CKD, HTN, HLD and metabolic encephalopathy.  Patient's family is concerned because patient's mental status has changed tremendously since her fall.  Patient has had difficulty with speech and with understanding.  Has also had systemic weakness and debility.  Patient was seen at [place] [facility] a week ago and diagnosed with renal insufficiency, cellulitis, fall and minor head injury.  Patient's CT at that time was negative for acute cerebral event.  Family states patient is sometime hard to rouse to alertness.  State patient is unable to respond sometimes, although she was completely coherent a few weeks ago.  Patient has not had insulin in over a week since moving from SNF in [place] to live with her family.  POC glucose is 284.  Family states she has not been eating or drinking even though they have attempted to get her to do so. Patient's family is concerned and wishes for patient to be admitted to the hospital.   Past Medical History  Diagnosis Date  . Gout   . Hypertension   . Tremors of nervous system   . Chicken pox   . Chronic kidney disease (CKD), stage IV (severe)   . Type II diabetes mellitus   . High cholesterol   . GERD (gastroesophageal reflux disease)   . Anemia   . History of blood transfusion   . Dementia     "dx'd today" (02/08/2014)  . Arthritis     "?knee" (02/08/2014)  . Aphasia 02/08/2014    Archie Endo 02/08/2014    No current facility-administered medications on file prior to visit.   Current Outpatient Prescriptions on File Prior to Visit  Medication Sig Dispense Refill  . amLODipine (NORVASC) 5 MG tablet Take 5 mg by mouth daily.      . clindamycin (CLEOCIN) 300 MG capsule Take 1 capsule  (300 mg total) by mouth every 6 (six) hours.  42 capsule  0  . enoxaparin (LOVENOX) 40 MG/0.4ML injection Inject 40 mg into the skin daily.      . ferrous sulfate 325 (65 FE) MG tablet Take 325 mg by mouth 2 (two) times daily with a meal.      . insulin detemir (LEVEMIR) 100 UNIT/ML injection Inject 25 Units into the skin at bedtime.      . meclizine (ANTIVERT) 25 MG tablet Take 25 mg by mouth 3 (three) times daily as needed for dizziness.      Marland Kitchen omeprazole (PRILOSEC) 40 MG capsule Take 40 mg by mouth daily.      Marland Kitchen oxyCODONE-acetaminophen (PERCOCET/ROXICET) 5-325 MG per tablet Take 1 tablet by mouth every 6 (six) hours as needed for severe pain.  60 tablet  0  . pioglitazone (ACTOS) 45 MG tablet Take 45 mg by mouth daily.      . primidone (MYSOLINE) 50 MG tablet Take 25 mg by mouth at bedtime.      . simvastatin (ZOCOR) 40 MG tablet Take 40 mg by mouth daily.      . sodium bicarbonate 650 MG tablet Take 650 mg by mouth 2 (two) times daily.        No Known Allergies  Family History  Problem Relation Age of Onset  .  Diabetes Mother 79    Deceased  . Alzheimer's disease Mother   . Diabetes Brother     x2  . Cancer Sister   . Diabetes Sister   . Hypertension Sister   . Hyperlipidemia Son   . Hypertension Son     #2  . Heart disease Son     #2    History   Social History  . Marital Status: Divorced    Spouse Name: N/A    Number of Children: N/A  . Years of Education: N/A   Social History Main Topics  . Smoking status: Former Smoker    Types: Cigarettes  . Smokeless tobacco: Never Used     Comment: 02/08/2014 "quit smoking > 20 yr ago"  . Alcohol Use: Yes     Comment: 02/08/2014 "may have an occasional glass of wine"  . Drug Use: No  . Sexual Activity: No   Other Topics Concern  . None   Social History Narrative  . None    Review of Systems - See HPI.  All other ROS are negative.  BP 168/78  Pulse 66  Temp(Src) 97.8 F (36.6 C) (Oral)  Resp 14  Ht _0  (1.626  m)  Wt 183 lb (83.008 kg)  BMI 31.40 kg/m2  SpO2 97%  Physical Exam  Vitals reviewed. Constitutional: She is well-developed, well-nourished, and in no distress.  HENT:  Head: Normocephalic and atraumatic.  Right Ear: External ear normal.  Left Ear: External ear normal.  Nose: Nose normal.  Mouth/Throat: Oropharynx is clear and moist. Mucous membranes are pale, dry and not cyanotic. No oropharyngeal exudate.  TM within normal limits bilaterally.  Eyes: Conjunctivae are normal. Pupils are equal, round, and reactive to light.  Patient is unable to understand commands to test for EOM and peripheral field testing.  Neck: Neck supple.  Cardiovascular: Normal rate, regular rhythm, normal heart sounds and intact distal pulses.   Pulmonary/Chest: Effort normal and breath sounds normal. No respiratory distress. She has no wheezes. She has no rales. She exhibits no tenderness.  Lymphadenopathy:    She has no cervical adenopathy.  Neurological: She has normal reflexes and intact cranial nerves. She displays abnormal speech. She displays facial symmetry.  Alert to person, but not place or time.  Strength 4/5 bilaterally.    Skin: Skin is warm and dry.  Poor turgor.  Psychiatric:  Patient unable to respond appropriately.  Seems slightly lethargic.  Responds to shouting.    Recent Results (from the past 2160 hour(s))  GLUCOSE, CAPILLARY     Status: Abnormal   Collection Time    01/31/14  3:34 PM      Result Value Ref Range   Glucose-Capillary 160 (*) 70 - 99 mg/dL  CBC     Status: Abnormal   Collection Time    01/31/14  4:05 PM      Result Value Ref Range   WBC 12.9 (*) 4.0 - 10.5 K/uL   RBC 3.33 (*) 3.87 - 5.11 MIL/uL   Hemoglobin 10.1 (*) 12.0 - 15.0 g/dL   HCT 31.2 (*) 36.0 - 46.0 %   MCV 93.7  78.0 - 100.0 fL   MCH 30.3  26.0 - 34.0 pg   MCHC 32.4  30.0 - 36.0 g/dL   RDW 13.1  11.5 - 15.5 %   Platelets 237  150 - 400 K/uL  COMPREHENSIVE METABOLIC PANEL     Status: Abnormal    Collection Time    01/31/14  4:05 PM      Result Value Ref Range   Sodium 137  137 - 147 mEq/L   Potassium 3.9  3.7 - 5.3 mEq/L   Chloride 98  96 - 112 mEq/L   CO2 26  19 - 32 mEq/L   Glucose, Bld 154 (*) 70 - 99 mg/dL   BUN 29 (*) 6 - 23 mg/dL   Creatinine, Ser 1.60 (*) 0.50 - 1.10 mg/dL   Calcium 10.9 (*) 8.4 - 10.5 mg/dL   Total Protein 8.5 (*) 6.0 - 8.3 g/dL   Albumin 3.6  3.5 - 5.2 g/dL   AST 35  0 - 37 U/L   ALT 75 (*) 0 - 35 U/L   Alkaline Phosphatase 152 (*) 39 - 117 U/L   Total Bilirubin 0.6  0.3 - 1.2 mg/dL   GFR calc non Af Amer 31 (*) >90 mL/min   GFR calc Af Amer 36 (*) >90 mL/min   Comment: (NOTE)     The eGFR has been calculated using the CKD EPI equation.     This calculation has not been validated in all clinical situations.     eGFR's persistently <90 mL/min signify possible Chronic Kidney     Disease.  URINALYSIS, ROUTINE W REFLEX MICROSCOPIC     Status: Abnormal   Collection Time    01/31/14  5:37 PM      Result Value Ref Range   Color, Urine YELLOW  YELLOW   APPearance CLEAR  CLEAR   Specific Gravity, Urine 1.011  1.005 - 1.030   pH 7.0  5.0 - 8.0   Glucose, UA NEGATIVE  NEGATIVE mg/dL   Hgb urine dipstick TRACE (*) NEGATIVE   Bilirubin Urine NEGATIVE  NEGATIVE   Ketones, ur NEGATIVE  NEGATIVE mg/dL   Protein, ur 100 (*) NEGATIVE mg/dL   Urobilinogen, UA 1.0  0.0 - 1.0 mg/dL   Nitrite NEGATIVE  NEGATIVE   Leukocytes, UA TRACE (*) NEGATIVE  URINE MICROSCOPIC-ADD ON     Status: None   Collection Time    01/31/14  5:37 PM      Result Value Ref Range   Squamous Epithelial / LPF RARE  RARE   WBC, UA 0-2  <3 WBC/hpf   RBC / HPF 0-2  <3 RBC/hpf   Bacteria, UA RARE  RARE  GLUCOSE, CAPILLARY     Status: Abnormal   Collection Time    02/08/14  5:23 PM      Result Value Ref Range   Glucose-Capillary 142 (*) 70 - 99 mg/dL  TSH     Status: None   Collection Time    02/08/14  6:50 PM      Result Value Ref Range   TSH 0.792  0.350 - 4.500 uIU/mL    Comment: Performed at [facility]  VITAMIN B12     Status: None   Collection Time    02/08/14  6:50 PM      Result Value Ref Range   Vitamin B-12 654  211 - 911 pg/mL   Comment: Performed at [facility]  RPR     Status: None   Collection Time    02/08/14  6:50 PM      Result Value Ref Range   RPR NON REACTIVE  NON REACTIVE   Comment: Performed at [facility]  CBC     Status: Abnormal   Collection Time    02/08/14  6:50 PM      Result  Value Ref Range   WBC 10.8 (*) 4.0 - 10.5 K/uL   RBC 3.22 (*) 3.87 - 5.11 MIL/uL   Hemoglobin 9.6 (*) 12.0 - 15.0 g/dL   HCT 29.2 (*) 36.0 - 46.0 %   MCV 90.7  78.0 - 100.0 fL   MCH 29.8  26.0 - 34.0 pg   MCHC 32.9  30.0 - 36.0 g/dL   RDW 13.3  11.5 - 15.5 %   Platelets 482 (*) 150 - 400 K/uL  COMPREHENSIVE METABOLIC PANEL     Status: Abnormal   Collection Time    02/08/14  6:50 PM      Result Value Ref Range   Sodium 142  137 - 147 mEq/L   Potassium 4.9  3.7 - 5.3 mEq/L   Chloride 102  96 - 112 mEq/L   CO2 26  19 - 32 mEq/L   Glucose, Bld 121 (*) 70 - 99 mg/dL   BUN 26 (*) 6 - 23 mg/dL   Creatinine, Ser 1.73 (*) 0.50 - 1.10 mg/dL   Calcium 10.9 (*) 8.4 - 10.5 mg/dL   Total Protein 8.3  6.0 - 8.3 g/dL   Albumin 3.3 (*) 3.5 - 5.2 g/dL   AST 33  0 - 37 U/L   ALT 43 (*) 0 - 35 U/L   Alkaline Phosphatase 148 (*) 39 - 117 U/L   Total Bilirubin 0.3  0.3 - 1.2 mg/dL   GFR calc non Af Amer 28 (*) >90 mL/min   GFR calc Af Amer 32 (*) >90 mL/min   Comment: (NOTE)     The eGFR has been calculated using the CKD EPI equation.     This calculation has not been validated in all clinical situations.     eGFR's persistently <90 mL/min signify possible Chronic Kidney     Disease.  MAGNESIUM     Status: None   Collection Time    02/08/14  6:50 PM      Result Value Ref Range   Magnesium 1.8  1.5 - 2.5 mg/dL  PHOSPHORUS     Status: None   Collection Time    02/08/14  6:50 PM      Result Value Ref Range   Phosphorus 2.7   2.3 - 4.6 mg/dL  HEMOGLOBIN A1C     Status: Abnormal   Collection Time    02/08/14  6:50 PM      Result Value Ref Range   Hemoglobin A1C 6.8 (*) <5.7 %   Comment: (NOTE)                                                                               According to the ADA Clinical Practice Recommendations for 2011, when     HbA1c is used as a screening test:      >=6.5%   Diagnostic of Diabetes Mellitus               (if abnormal result is confirmed)     5.7-6.4%   Increased risk of developing Diabetes Mellitus     References:Diagnosis and Classification of Diabetes Mellitus,Diabetes     PZWC,5852,77(OEUMP 1):S62-S69 and Standards of Medical Care in  Diabetes - 2011,Diabetes Care,2011,34 (Suppl 1):S11-S61.   Mean Plasma Glucose 148 (*) <117 mg/dL   Comment: Performed at [place], CAPILLARY     Status: Abnormal   Collection Time    02/08/14 10:28 PM      Result Value Ref Range   Glucose-Capillary 113 (*) 70 - 99 mg/dL  COMPREHENSIVE METABOLIC PANEL     Status: Abnormal   Collection Time    02/09/14  3:32 AM      Result Value Ref Range   Sodium 140  137 - 147 mEq/L   Potassium 4.7  3.7 - 5.3 mEq/L   Chloride 102  96 - 112 mEq/L   CO2 24  19 - 32 mEq/L   Glucose, Bld 121 (*) 70 - 99 mg/dL   BUN 25 (*) 6 - 23 mg/dL   Creatinine, Ser 1.76 (*) 0.50 - 1.10 mg/dL   Calcium 10.8 (*) 8.4 - 10.5 mg/dL   Total Protein 7.1  6.0 - 8.3 g/dL   Albumin 2.9 (*) 3.5 - 5.2 g/dL   AST 26  0 - 37 U/L   ALT 34  0 - 35 U/L   Alkaline Phosphatase 128 (*) 39 - 117 U/L   Total Bilirubin 0.3  0.3 - 1.2 mg/dL   GFR calc non Af Amer 27 (*) >90 mL/min   GFR calc Af Amer 32 (*) >90 mL/min   Comment: (NOTE)     The eGFR has been calculated using the CKD EPI equation.     This calculation has not been validated in all clinical situations.     eGFR's persistently <90 mL/min signify possible Chronic Kidney     Disease.  CBC     Status: Abnormal   Collection Time    02/09/14   3:32 AM      Result Value Ref Range   WBC 9.2  4.0 - 10.5 K/uL   RBC 2.92 (*) 3.87 - 5.11 MIL/uL   Hemoglobin 8.7 (*) 12.0 - 15.0 g/dL   HCT 26.7 (*) 36.0 - 46.0 %   MCV 91.4  78.0 - 100.0 fL   MCH 29.8  26.0 - 34.0 pg   MCHC 32.6  30.0 - 36.0 g/dL   RDW 13.3  11.5 - 15.5 %   Platelets 462 (*) 150 - 400 K/uL  GLUCOSE, CAPILLARY     Status: Abnormal   Collection Time    02/09/14  8:52 AM      Result Value Ref Range   Glucose-Capillary 136 (*) 70 - 99 mg/dL  GLUCOSE, CAPILLARY     Status: Abnormal   Collection Time    02/09/14 12:31 PM      Result Value Ref Range   Glucose-Capillary 125 (*) 70 - 99 mg/dL    Assessment/Plan: Altered mental status Unfortunately patient is unknown to me.  Patient does have history of metabolic encephalopathy.  Recent CT negative for stroke.  Patient also with CKD IV and uncontrolled DM.  Signs of mild dehydration on examination.  Patient directly admitted to [facility] under the care of [facility] for further evaluation, monitoring and treatment.  Dehydration Unfortunately patient is unknown to me.  Patient does have history of metabolic encephalopathy.  Recent CT negative for stroke.  Patient also with CKD IV and uncontrolled DM.  Signs of mild dehydration on examination.  Patient directly admitted to [facility] under the care of [facility] for further evaluation, monitoring and treatment.

## 2014-02-08 NOTE — Progress Notes (Signed)
NURSING PROGRESS NOTE  Cindee LameJulienne Pogue [id number] Admission Data: 02/08/2014 7:53 PM Attending Provider: Marinda ElkAbraham Feliz Ortiz, MD PCP:Pcp Not In System Code Status:full  Cindee LameJulienne Lorton is a 73 y.o. female patient admitted from ED:  -No acute distress noted.  -No complaints of shortness of breath.  -No complaints of chest pain.   Cardiac Monitoring: Box #18 in place. Cardiac monitor yields:normal sinus rhythm.  Blood pressure 159/72, pulse 69, temperature 98.2 F (36.8 C), temperature source Oral, resp. rate 14, height 5\' 4"  (1.626 m), weight 79.788 kg (175 lb 14.4 oz), SpO2 98.00%.   IV Fluids:None  Allergies:  Review of patient's allergies indicates no known allergies.  Past Medical History:   has a past medical history of Gout; Hypertension; Tremors of nervous system; Chicken pox; Chronic kidney disease (CKD), stage IV (severe); Type II diabetes mellitus; High cholesterol; GERD (gastroesophageal reflux disease); Anemia; History of blood transfusion; Dementia; Arthritis; and Aphasia (02/08/2014).  Past Surgical History:   has past surgical history that includes Cholecystectomy and Appendectomy.  Social History:   reports that she has quit smoking. Her smoking use included Cigarettes. She smoked 0.00 packs per day. She has never used smokeless tobacco. She reports that she drinks alcohol. She reports that she does not use illicit drugs.  Skin: scattered bruising and Bilateral lower extremity edema  Patient/Family orientated to room. Information packet given to patient/family. Admission inpatient armband information verified with patient/family to include name and date of birth and placed on patient arm. Side rails up x 2, fall assessment and education completed with patient/family. Patient/family able to verbalize understanding of risk associated with falls and verbalized understanding to call for assistance before getting out of bed. Call light within reach. Patient/family able to voice  and demonstrate understanding of unit orientation instructions.    Will continue to evaluate and treat per MD orders.

## 2014-02-08 NOTE — Progress Notes (Signed)
Pre visit review using our clinic review tool, if applicable. No additional management support is needed unless otherwise documented below in the visit note/SLS  

## 2014-02-09 ENCOUNTER — Inpatient Hospital Stay (HOSPITAL_COMMUNITY): Payer: Medicare Other

## 2014-02-09 DIAGNOSIS — G9340 Encephalopathy, unspecified: Secondary | ICD-10-CM

## 2014-02-09 DIAGNOSIS — R4182 Altered mental status, unspecified: Secondary | ICD-10-CM | POA: Insufficient documentation

## 2014-02-09 DIAGNOSIS — E860 Dehydration: Secondary | ICD-10-CM | POA: Insufficient documentation

## 2014-02-09 LAB — COMPREHENSIVE METABOLIC PANEL
ALBUMIN: 2.9 g/dL — AB (ref 3.5–5.2)
ALK PHOS: 128 U/L — AB (ref 39–117)
ALT: 34 U/L (ref 0–35)
AST: 26 U/L (ref 0–37)
BUN: 25 mg/dL — ABNORMAL HIGH (ref 6–23)
CO2: 24 mEq/L (ref 19–32)
Calcium: 10.8 mg/dL — ABNORMAL HIGH (ref 8.4–10.5)
Chloride: 102 mEq/L (ref 96–112)
Creatinine, Ser: 1.76 mg/dL — ABNORMAL HIGH (ref 0.50–1.10)
GFR calc non Af Amer: 27 mL/min — ABNORMAL LOW (ref >90)
GFR, EST AFRICAN AMERICAN: 32 mL/min — AB (ref >90)
GLUCOSE: 121 mg/dL — AB (ref 70–99)
POTASSIUM: 4.7 meq/L (ref 3.7–5.3)
SODIUM: 140 meq/L (ref 137–147)
TOTAL PROTEIN: 7.1 g/dL (ref 6.0–8.3)
Total Bilirubin: 0.3 mg/dL (ref 0.3–1.2)

## 2014-02-09 LAB — HIV ANTIBODY (ROUTINE TESTING W REFLEX): HIV: NONREACTIVE

## 2014-02-09 LAB — VITAMIN B12: Vitamin B-12: 654 pg/mL (ref 211–911)

## 2014-02-09 LAB — RAPID URINE DRUG SCREEN, HOSP PERFORMED
Amphetamines: NOT DETECTED
BARBITURATES: POSITIVE — AB
Benzodiazepines: NOT DETECTED
Cocaine: NOT DETECTED
Opiates: NOT DETECTED
Tetrahydrocannabinol: NOT DETECTED

## 2014-02-09 LAB — RPR: RPR Ser Ql: NONREACTIVE

## 2014-02-09 LAB — FERRITIN: Ferritin: 1547 ng/mL — ABNORMAL HIGH (ref 10–291)

## 2014-02-09 LAB — IRON AND TIBC
IRON: 70 ug/dL (ref 42–135)
SATURATION RATIOS: 31 % (ref 20–55)
TIBC: 227 ug/dL — ABNORMAL LOW (ref 250–470)
UIBC: 157 ug/dL (ref 125–400)

## 2014-02-09 LAB — GLUCOSE, CAPILLARY
GLUCOSE-CAPILLARY: 125 mg/dL — AB (ref 70–99)
Glucose-Capillary: 136 mg/dL — ABNORMAL HIGH (ref 70–99)
Glucose-Capillary: 124 mg/dL — ABNORMAL HIGH (ref 70–99)
Glucose-Capillary: 129 mg/dL — ABNORMAL HIGH (ref 70–99)

## 2014-02-09 LAB — CALCIUM, IONIZED: Calcium, Ion: 1.47 mmol/L — ABNORMAL HIGH (ref 1.13–1.30)

## 2014-02-09 LAB — CBC
HCT: 26.7 % — ABNORMAL LOW (ref 36.0–46.0)
HEMOGLOBIN: 8.7 g/dL — AB (ref 12.0–15.0)
MCH: 29.8 pg (ref 26.0–34.0)
MCHC: 32.6 g/dL (ref 30.0–36.0)
MCV: 91.4 fL (ref 78.0–100.0)
Platelets: 462 10*3/uL — ABNORMAL HIGH (ref 150–400)
RBC: 2.92 MIL/uL — ABNORMAL LOW (ref 3.87–5.11)
RDW: 13.3 % (ref 11.5–15.5)
WBC: 9.2 10*3/uL (ref 4.0–10.5)

## 2014-02-09 LAB — TSH: TSH: 0.792 u[IU]/mL (ref 0.350–4.500)

## 2014-02-09 LAB — HEMOGLOBIN A1C
Hgb A1c MFr Bld: 6.8 % — ABNORMAL HIGH (ref <5.7)
Mean Plasma Glucose: 148 mg/dL — ABNORMAL HIGH (ref <117)

## 2014-02-09 LAB — AMMONIA: AMMONIA: 38 umol/L (ref 11–60)

## 2014-02-09 MED ORDER — UNABLE TO FIND: Status: AC

## 2014-02-09 MED ORDER — SODIUM CHLORIDE 0.9 % IV SOLN
INTRAVENOUS | Status: DC
  Administered 2014-02-09 – 2014-02-12 (×7): via INTRAVENOUS
  Administered 2014-02-13: 100 mL via INTRAVENOUS
  Administered 2014-02-13: 13:00:00 via INTRAVENOUS

## 2014-02-09 MED ORDER — PANTOPRAZOLE SODIUM 40 MG IV SOLR
40.0000 mg | INTRAVENOUS | Status: DC
  Administered 2014-02-09 – 2014-02-11 (×3): 40 mg via INTRAVENOUS
  Filled 2014-02-09 (×3): qty 40

## 2014-02-09 MED ORDER — LORAZEPAM 2 MG/ML IJ SOLN
INTRAMUSCULAR | Status: AC
  Administered 2014-02-09: 1 mg via INTRAVENOUS
  Filled 2014-02-09: qty 1

## 2014-02-09 MED ORDER — INSULIN ASPART 100 UNIT/ML ~~LOC~~ SOLN: 0.0000 [IU] | SUBCUTANEOUS | Status: DC

## 2014-02-09 MED ORDER — INSULIN ASPART 100 UNIT/ML ~~LOC~~ SOLN
0.0000 [IU] | SUBCUTANEOUS | Status: DC
Start: 2014-02-09 — End: 2014-02-11
  Administered 2014-02-10: 1 [IU] via SUBCUTANEOUS
  Administered 2014-02-10: 2 [IU] via SUBCUTANEOUS
  Administered 2014-02-10 – 2014-02-11 (×4): 1 [IU] via SUBCUTANEOUS

## 2014-02-09 MED ORDER — LORAZEPAM 2 MG/ML IJ SOLN
1.0000 mg | Freq: Once | INTRAMUSCULAR | Status: AC
  Administered 2014-02-09: 1 mg via INTRAVENOUS

## 2014-02-09 MED ORDER — HYDRALAZINE HCL 20 MG/ML IJ SOLN
5.0000 mg | Freq: Three times a day (TID) | INTRAMUSCULAR | Status: DC
  Administered 2014-02-09 – 2014-02-10 (×2): 5 mg via INTRAVENOUS
  Filled 2014-02-09 (×6): qty 0.25

## 2014-02-09 MED ORDER — LORAZEPAM 2 MG/ML IJ SOLN: 1.0000 mg | Freq: Once | INTRAMUSCULAR | Status: DC

## 2014-02-09 NOTE — Assessment & Plan Note (Signed)
Unfortunately patient is unknown to me.  Patient does have history of metabolic encephalopathy.  Recent CT negative for stroke.  Patient also with CKD IV and uncontrolled DM.  Signs of mild dehydration on examination.  Patient directly admitted to [place] under the care of [facility] for further evaluation, monitoring and treatment. 

## 2014-02-09 NOTE — Progress Notes (Signed)
INITIAL NUTRITION ASSESSMENT  DOCUMENTATION CODES Per approved criteria  -Obesity Unspecified   INTERVENTION:  Recommend NPO status until swallow evaluation completed RD to follow for nutrition care plan, add interventions accordingly  NUTRITION DIAGNOSIS: Inadequate oral intake related to minimal responsiveness as evidenced by PO intake 0%  Goal: Pt to meet >/= 90% of their estimated nutrition needs   Monitor:  PO & supplemental intake, weight, labs, I/O's  Reason for Assessment: Malnutrition Screening Tool Report  73 y.o. female  Admitting Dx: confusion   ASSESSMENT: Patient with PMH of DM, HTN and CKD; presented to PCP's office with a two-week history of worsening confusion expressive aphasia.   RD unable to obtain nutrition hx from patient at this time; minimally responsive; RD spoke with patient's daughter in law via telephone who reported patient was "picking at her food" for approximately 2 weeks; [place] [facility];" bedside swallow evaluation pending.  RD unable to complete Nutrition Focused Physical Exam at this time.  Height: Ht Readings from Last 1 Encounters:  02/08/14 5\' 4"  (1.626 m)    Weight: Wt Readings from Last 1 Encounters:  02/08/14 177 lb 4.8 oz (80.423 kg)    Ideal Body Weight: 120 lb  % Ideal Body Weight: 147%  Wt Readings from Last 10 Encounters:  02/08/14 177 lb 4.8 oz (80.423 kg)  02/08/14 183 lb (83.008 kg)  02/02/14 183 lb (83.008 kg)    Usual Body Weight: unable to obtain  % Usual Body Weight: ---  BMI:  Body mass index is 30.42 kg/(m^2).  Estimated Nutritional Needs: Kcal: 1600-1900 Protein: 80-90 gm Fluid: 1200 ml  Skin: Intact  Diet Order: Renal w/1200 ml fluid restriction   EDUCATION NEEDS: -No education needs identified at this time  Labs:   Recent Labs Lab 02/08/14 1850 02/09/14 0332  NA 142 140  K 4.9 4.7  CL 102 102  CO2 26 24  BUN 26* 25*  CREATININE  1.73* 1.76*  CALCIUM 10.9* 10.8*  MG 1.8  --   PHOS 2.7  --   GLUCOSE 121* 121*    CBG (last 3)   Recent Labs  02/08/14 2228 02/09/14 0852 02/09/14 1231  GLUCAP 113* 136* 125*    Scheduled Meds: . atorvastatin  20 mg Oral QHS  . enoxaparin  40 mg Subcutaneous Q24H  . hydrALAZINE  5 mg Intravenous 3 times per day  . insulin aspart  0-9 Units Subcutaneous TID WC  . pantoprazole (PROTONIX) IV  40 mg Intravenous Q24H  . sodium bicarbonate  650 mg Oral BID  . sodium chloride  3 mL Intravenous Q12H    Continuous Infusions: . sodium chloride Stopped (02/09/14 1256)  . sodium chloride 75 mL/hr (02/09/14 1256)    Past Medical History  Diagnosis Date  . Gout   . Hypertension   . Tremors of nervous system   . Chicken pox   . Chronic kidney disease (CKD), stage IV (severe)   . Type II diabetes mellitus   . High cholesterol   . GERD (gastroesophageal reflux disease)   . Anemia   . History of blood transfusion   . Dementia     "dx'd today" (02/08/2014)  . Arthritis     "?knee" (02/08/2014)  . Aphasia 02/08/2014    Hattie Perch/notes 02/08/2014    Past Surgical History  Procedure Laterality Date  . Cholecystectomy    . Appendectomy      Maureen ChattersKatie Dyshawn Cangelosi, RD, LDN Pager #:  [phone] After-Hours Pager #: [phone]

## 2014-02-09 NOTE — Progress Notes (Signed)
[facility] PROGRESS NOTE  Karen LameJulienne Mcknight WGN:[id number] DOB: 04-25-1941 DOA: 02/08/2014 PCP: Pcp Not In System  Assessment/Plan: Acute encephalopathy -Multifactorial including PRES, hypercalcemia, renal failure -check ammonia -MRI brain-->T2 hyperintensities L>R parieto-occipital lobe, neg for acute infarct; scattered microhemorrhages with evidence of prior SAH -TSH 0.792 -RPR nonreactive -Serum B12--654 -Check RBC folate, HIV -Chest x-ray -Urinalysis negative for pyuria -Urine drug screen Hypercalcemia -Corrected calcium 11.68 -Intact PTH -Ionized calcium -IV fluids Globulin Gap -Check SPEP and UPEP Diabetes mellitus type 2 -Hemoglobin A1c 6.8 -d/c levemir for now as pt has very little po intake -continue novolog sliding scale Renal failure -Cannot make a diagnosis of CKD without prior records -I have asked the patient's son to bring in her prior records -IV fluids bilateral foot pain -02/02/2014 x-ray of the right ankle reveals a small avulsion fracture lateral malleolus -X-ray left foot negative for fracture -Repeat x-ray right ankle Family Communication:   Updated son on the telephone Disposition Plan:   Home when medically stable       Procedures/Studies: Dg Chest 2 View  01/31/2014   CLINICAL DATA:  Bilateral foot swelling and leg swelling for 6 days, fell 2 weeks ago  EXAM: CHEST  2 VIEW  COMPARISON:  None.  FINDINGS: Heart size is upper normal. Vascular pattern is normal. Lungs are clear. No effusion or or pneumothorax. Bony thorax intact.  IMPRESSION: No acute findings   Electronically Signed   By: Esperanza Heiraymond  Rubner M.D.   On: 01/31/2014 17:08   Dg Wrist Complete Left  01/31/2014   CLINICAL DATA:  Larey SeatFell 2 weeks ago with wrist pain  EXAM: LEFT WRIST - COMPLETE 3+ VIEW  COMPARISON:  None.  FINDINGS: Old fracture ulnar styloid process, corticated and mildly displaced. Triangular fibrocartilage calcification noted. No evidence of acute fracture or dislocation.   IMPRESSION: No acute findings   Electronically Signed   By: Esperanza Heiraymond  Rubner M.D.   On: 01/31/2014 17:07   Dg Ankle Complete Right  02/02/2014   CLINICAL DATA:  Fall pain swelling  EXAM: RIGHT ANKLE - COMPLETE 3+ VIEW  COMPARISON:  None.  FINDINGS: Significant diffuse soft tissue swelling. Tiny bony fragment off of the tip of the lateral malleolus. The mortise is intact.  IMPRESSION: Significant soft tissue swelling consistent with sprain. There also appears to be a tiny avulsion fracture off of the tip of the lateral malleolus.   Electronically Signed   By: Esperanza Heiraymond  Rubner M.D.   On: 02/02/2014 12:41   Ct Head Wo Contrast  01/31/2014   CLINICAL DATA:  Persistent headaches after falling 2 weeks ago.  EXAM: CT HEAD WITHOUT CONTRAST  TECHNIQUE: Contiguous axial images were obtained from the base of the skull through the vertex without intravenous contrast.  COMPARISON:  None.  FINDINGS: There is no evidence of acute intracranial hemorrhage, mass lesion, brain edema or extra-axial fluid collection. The ventricles and subarachnoid spaces are appropriately sized for age. There is no CT evidence of acute cortical infarction. Mild intracranial vascular calcifications are noted.  The visualized paranasal sinuses, mastoid air cells and middle ears are clear. The calvarium is intact.  IMPRESSION: Unremarkable noncontrast head CT for age.   Electronically Signed   By: Roxy HorsemanBill  Veazey M.D.   On: 01/31/2014 17:10   Mr Brain Wo Contrast  02/09/2014   CLINICAL DATA:  Confusion.  Altered mental status.  EXAM: MRI HEAD WITHOUT CONTRAST  TECHNIQUE: Multiplanar, multiecho pulse sequences of the brain and surrounding structures were obtained without intravenous contrast.  COMPARISON:  Head CT 01/31/2014  FINDINGS: There is no acute infarct. Susceptibility artifact is present in multiple left greater than right parietal, posterior left frontal, and bilateral occipital lobe sulci consistent with prior subarachnoid hemorrhage. There  are also scattered, small foci of microhemorrhage in cortical and subcortical locations within both cerebral hemispheres, predominantly posteriorly, and within the cerebellum. There are patchy periventricular white matter T2 hyperintensities. There are also patchy T2 hyperintensities within the left greater than right parietal and occipital lobes which appear largely subcortical but with some cortical involvement as well. Small subcortical T2 hyperintensity is also present in the left frontal lobe near the vertex. There is no evidence of mass, midline shift, or extra-axial fluid collection.  Marrow signal in the clivus is slightly heterogeneous, nonspecific. Major intracranial vascular flow voids are unremarkable. Cerebral volume is within normal limits for age. Prior bilateral cataract surgery is noted. Paranasal sinuses and mastoid air cells are clear.  IMPRESSION: 1. No evidence of acute infarct. 2. Patchy T2 hyperintensities predominantly involving the left greater than right parietal and occipital lobes. This is nonspecific, with considerations including PRES and sequelae of prior ischemia. 3. Scattered foci of microhemorrhage within the left greater than right cerebral hemispheres and to a lesser extent cerebellum, predominantly posteriorly. Microhemorrhages can be seen with PRES as well as cerebral amyloid angiopathy and prior infarcts. 4. 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   Electronically Signed   By: Sebastian Ache   On: 02/09/2014 09:28   US Venous Img Lower Unilateral Right  01/31/2014   CLINICAL DATA:  Right lower leg swelling. Fell 6 days ago. Patient unable to provide months history at this time.  EXAM: RIGHT LOWER EXTREMITY VENOUS DOPPLER ULTRASOUND  TECHNIQUE: Gray-scale sonography with graded compression, as well as color Doppler and duplex ultrasound, were performed to evaluate the deep venous system from the level of the common femoral vein through the  popliteal and proximal calf veins. Spectral Doppler was utilized to evaluate flow at rest and with distal augmentation maneuvers.  COMPARISON:  None.  FINDINGS: Thrombus within deep veins:  None visualized.  Compressibility of deep veins:  Normal.  Duplex waveform respiratory phasicity:  Normal.  Duplex waveform response to augmentation:  Normal.  Venous reflux:  None visualized.  The peroneal vein is not well visualized.  Other findings: Subcutaneous edema is noted in the mid to lower calf region and ankle.  IMPRESSION: 1. No evidence of right lower extremity deep venous thrombosis. 2. Subcutaneous edema of the right lower extremity at the level of the calf and ankle.   Electronically Signed   By: Britta Mccreedy M.D.   On: 01/31/2014 16:55   Dg Foot 2 Views Left  02/08/2014   CLINICAL DATA:  Left foot pain.  EXAM: LEFT FOOT - 2 VIEW  COMPARISON:  None.  FINDINGS: Arthritic changes in the left first MTP joint with joint space narrowing. Probable erosions. Overlying soft tissue swelling.  No fracture, subluxation or dislocation. Normal bone mineralization.  IMPRESSION: Arthritic changes in the left first MTP joint with joint space narrowing and erosions with overlying soft tissue swelling. Suspect gout.   Electronically Signed   By: Charlett Nose M.D.   On: 02/08/2014 22:51         Subjective: Patient is awake and alert but not answering questions appropriately. No reports of chest pain, respiratory distress, vomiting, diarrhea  Objective: Filed Vitals:   02/08/14 1735 02/08/14 2233 02/09/14 0427 02/09/14 0855  BP: 159/72 168/58  169/65 186/91  Pulse: 69 70 70 70  Temp: 98.2 F (36.8 C) 99 F (37.2 C) 99.5 F (37.5 C) 98.9 F (37.2 C)  TempSrc: Oral Oral Oral Oral  Resp: 14 17 16 15   Height: 5\' 4"  (1.626 m)     Weight: 79.788 kg (175 lb 14.4 oz) 80.423 kg (177 lb 4.8 oz)    SpO2: 98% 98% 99% 94%   No intake or output data in the 24 hours ending 02/09/14 1226 Weight change:   Exam:   General:  Pt is alert, follows commands appropriately, not in acute distress  HEENT: No icterus, No thrush, No neck mass, [place]/AT  Cardiovascular: RRR, S1/S2, no rubs, no gallops  Respiratory: CTA bilaterally, no wheezing, no crackles, no rhonchi  Abdomen: Soft/+BS, non tender, non distended, no guarding  Extremities: 1+ edema, No lymphangitis, No petechiae, No rashes, no synovitis  Data Reviewed: Basic Metabolic Panel:  Recent Labs Lab 02/08/14 1850 02/09/14 0332  NA 142 140  K 4.9 4.7  CL 102 102  CO2 26 24  GLUCOSE 121* 121*  BUN 26* 25*  CREATININE 1.73* 1.76*  CALCIUM 10.9* 10.8*  MG 1.8  --   PHOS 2.7  --    Liver Function Tests:  Recent Labs Lab 02/08/14 1850 02/09/14 0332  AST 33 26  ALT 43* 34  ALKPHOS 148* 128*  BILITOT 0.3 0.3  PROT 8.3 7.1  ALBUMIN 3.3* 2.9*   No results found for this basename: LIPASE, AMYLASE,  in the last 168 hours No results found for this basename: AMMONIA,  in the last 168 hours CBC:  Recent Labs Lab 02/08/14 1850 02/09/14 0332  WBC 10.8* 9.2  HGB 9.6* 8.7*  HCT 29.2* 26.7*  MCV 90.7 91.4  PLT 482* 462*   Cardiac Enzymes: No results found for this basename: CKTOTAL, CKMB, CKMBINDEX, TROPONINI,  in the last 168 hours BNP: No components found with this basename: POCBNP,  CBG:  Recent Labs Lab 02/08/14 1723 02/08/14 2228 02/09/14 0852  GLUCAP 142* 113* 136*    No results found for this or any previous visit (from the past 240 hour(s)).   Scheduled Meds: . amLODipine  5 mg Oral Daily  . atorvastatin  20 mg Oral QHS  . enoxaparin  40 mg Subcutaneous Q24H  . insulin aspart  0-9 Units Subcutaneous TID WC  . insulin detemir  10 Units Subcutaneous QHS  . pantoprazole  40 mg Oral Daily  . sodium bicarbonate  650 mg Oral BID  . sodium chloride  3 mL Intravenous Q12H   Continuous Infusions: . sodium chloride 20 mL/hr (02/09/14 0857)     Charise Leinbach, DO  [facility] Pager [phone]  If  7PM-7AM, please contact night-coverage [email or web address] Password [email or web address] 02/09/2014, 12:26 PM   LOS: 1 day

## 2014-02-09 NOTE — Clinical Social Work Psychosocial (Signed)
Clinical Social Work Department BRIEF PSYCHOSOCIAL ASSESSMENT 02/09/2014  Patient:  Karen Mcknight,Karen Mcknight     Account Number:  [id number]     Admit date:  02/08/2014  Clinical Social Worker:  Delmer IslamRAWFORD,Knute Mazzuca, LCSW  Date/Time:  02/09/2014 04:05 AM  Referred by:  Physician  Date Referred:  02/08/2014 Referred for  SNF Placement   Other Referral:   Interview type:  Family Other interview type:   CSW talked with son Julio SicksGeoffrey Barro [phone] and [phone])    PSYCHOSOCIAL DATA Living Status:  ALONE Admitted from facility:   Level of care:   Primary support name:  Julio SicksGeoffrey Ishmael Primary support relationship to patient:  CHILD, ADULT Degree of support available:   [place] in [place] and son lives in [place]. Per son, he has a brother who lives in [place]. Per Mr. Elmon KirschnerWatt, his brother got out of prison 2  months ago.    CURRENT CONCERNS Current Concerns  Post-Acute Placement   Other Concerns:    SOCIAL WORK ASSESSMENT / PLAN CSW talked by phone with Mr. Stogner regarding discharge plans and MD's recommendation of ST rehab for his mother. Son provided CSW with brief history of mother's illness, which led her to going to a facility in [place]. Mr. Elmon KirschnerWatt stated that his mother is not a resident of [facility], but lives in [place], [place]. Son reported that [facility] to get a 2nd opinion about her medical status/care. Mr. Elmon KirschnerWatt indicated that [place] one hospital and he does not feel she got appropriate care there. Mr. Elmon KirschnerWatt also voiced his concern about his brother messing up things for his mother in [place] in terms of her housing, as she lives in Section 8 housing per Mr. Kreitzer.    CSW talked with son about [place] and explained process. Patient will be provided with SNF list.   Assessment/plan status:  Psychosocial Support/Ongoing Assessment of Needs Other assessment/ plan:   Information/referral to community resources:   Talked with son  about POA.    PATIENT'S/FAMILY'S RESPONSE TO PLAN OF CARE: CSW unable to talk with patient as she is alert to self only. Mr. Elmon KirschnerWatt concerned about patient's health and the care she receives.

## 2014-02-09 NOTE — Progress Notes (Signed)
MRI called to notify pt is agitated and unable to complete testing. No PRN medication for anxiety/agitation. MD notified via text page. Will continue to monitor.

## 2014-02-09 NOTE — Assessment & Plan Note (Signed)
Unfortunately patient is unknown to me.  Patient does have history of metabolic encephalopathy.  Recent CT negative for stroke.  Patient also with CKD IV and uncontrolled DM.  Signs of mild dehydration on examination.  [facility] under the care of [facility] for further evaluation, monitoring and treatment.

## 2014-02-09 NOTE — Clinical Social Work Placement (Addendum)
Clinical Social Work Department CLINICAL SOCIAL WORK PLACEMENT NOTE 02/09/2014  Patient:  Karen Mcknight,Karen Mcknight  Account Number:  [id number] Admit date:  02/08/2014  Clinical Social Worker:  Genelle BalVANESSA Mahoganie Basher, LCSW  Date/time:  02/09/2014 04:20 AM  Clinical Social Work is seeking post-discharge placement for this patient at the following level of care:   SKILLED NURSING   (*CSW will update this form in Epic as items are completed)     Patient/family provided with [facility] Department of Clinical Social Work's list of facilities offering this level of care within the geographic area requested by the patient (or if unable, by the patient's family).  02/09/2014  Patient/family informed of their freedom to choose among providers that offer the needed level of care, that participate in Medicare, Medicaid or managed care program needed by the patient, have an available bed and are willing to accept the patient.    Patient/family informed of [facility]' ownership interest in [facility], as well as of the fact that they are under no obligation to receive care at this facility.  PASARR submitted to EDS on 02/09/2014 PASARR number received from EDS on 02/13/14 - [phone]  FL2 transmitted to all facilities in geographic area requested by pt/family on 02/09/14  FL2 transmitted again to facilities in [facility] [place] and [place] with updated clinicals: 02/12/14  Patient informed that his/her managed care company has contracts with or will negotiate with  certain facilities, including the following:     Patient/family informed of bed offers received: 02/12/14  [facility] [place] Physician recommends and patient chooses bed at    Patient to be [facility] on 02/13/14   Patient to be transferred to facility by ambulance  The following physician request were entered in Epic:  Additional Comments: 02/12/14: Son wants a  facility [place] or nearby as he lives in [place].   Alvina ChouV. Wilhemina Grall, LCSW [phone]

## 2014-02-09 NOTE — Progress Notes (Signed)
Jennifer CMT notified pt is back on monitor, returned from MRI. Will continue to monitor.

## 2014-02-09 NOTE — Clinical Social Work Note (Signed)
CSW talked with patient's son Julio SicksGeoffrey Gell and his friend Velda ShellLin Pringle regarding his mother. Continued discussion we started by phone regarding advance directives and Durable POA. Son understands that patient has to initiate these documents and at this time she is unable to do so. Son was provided with a statement completed by charge nurse and signed by MD regarding Ms. Elmon KirschnerWatt being a patient in the hospital and this was faxed to patient's landlord in [place] by CSW. Mr. Elmon KirschnerWatt was also provided with the fax confirmation. He was provided with an Advanced Directive document, although he understands that because his mother is an [place] resident, her Proofreader[employer]vance Directive documents (once she is able to do them, should be [employer] Advanced Directives.   CSW talked more with son about SNF for ST rehab and he was provided with the SNF list for [place] [facility] [place]. CSW informed Mr. Maino that he will be contacted next week with facility responses.  Genelle BalVanessa Brittinie Wherley, MSW, LCSW [phone]

## 2014-02-09 NOTE — Progress Notes (Signed)
SLP Cancellation Note  Patient Details Name: Karen LameJulienne Cannella MRN: [id number] DOB: 1941-01-11   Cancelled treatment:        Per RN, pt has just returned from MRI, and was given Ativan for the procedure.  Pt is currently insufficiently arousable for swallow evaluation.  Will continue efforts.  [name] [name], [name], [name] [phone]  [name], [name] 02/09/2014, 9:26 AM

## 2014-02-09 NOTE — Progress Notes (Signed)
SLP Cancellation Note  Patient Details Name: Karen LameJulienne Mcknight MRN: [id number] DOB: [birth year]   Cancelled treatment:        SLP attempted BSE again, seeing new MD order.  Pt slightly more responsive at this time, however, still inappropriate for evaluation of swallow safety.  Will continue efforts. RN aware,  Teren Franckowiak B. [name], [facility], [name] [phone]  [name], Jeslynn Hollander Brown 02/09/2014, 1:42 PM

## 2014-02-09 NOTE — Progress Notes (Signed)
Pt refusing PO medication at this time. Will attempt as medication wears off. Will continue to monitor.

## 2014-02-10 DIAGNOSIS — S8263XA Displaced fracture of lateral malleolus of unspecified fibula, initial encounter for closed fracture: Secondary | ICD-10-CM

## 2014-02-10 LAB — BASIC METABOLIC PANEL
BUN: 24 mg/dL — AB (ref 6–23)
CALCIUM: 10.1 mg/dL (ref 8.4–10.5)
CHLORIDE: 104 meq/L (ref 96–112)
CO2: 20 mEq/L (ref 19–32)
CREATININE: 1.46 mg/dL — AB (ref 0.50–1.10)
GFR calc Af Amer: 40 mL/min — ABNORMAL LOW (ref >90)
GFR calc non Af Amer: 34 mL/min — ABNORMAL LOW (ref >90)
GLUCOSE: 156 mg/dL — AB (ref 70–99)
Potassium: 4.5 mEq/L (ref 3.7–5.3)
Sodium: 139 mEq/L (ref 137–147)

## 2014-02-10 LAB — URINALYSIS, ROUTINE W REFLEX MICROSCOPIC
Bilirubin Urine: NEGATIVE
GLUCOSE, UA: NEGATIVE mg/dL
HGB URINE DIPSTICK: NEGATIVE
Ketones, ur: NEGATIVE mg/dL
LEUKOCYTES UA: NEGATIVE
Nitrite: NEGATIVE
PROTEIN: 100 mg/dL — AB
SPECIFIC GRAVITY, URINE: 1.017 (ref 1.005–1.030)
UROBILINOGEN UA: 1 mg/dL (ref 0.0–1.0)
pH: 7 (ref 5.0–8.0)

## 2014-02-10 LAB — URINE MICROSCOPIC-ADD ON

## 2014-02-10 LAB — HEPATIC FUNCTION PANEL
ALT: 27 U/L (ref 0–35)
AST: 21 U/L (ref 0–37)
Albumin: 2.9 g/dL — ABNORMAL LOW (ref 3.5–5.2)
Alkaline Phosphatase: 124 U/L — ABNORMAL HIGH (ref 39–117)
BILIRUBIN TOTAL: 0.3 mg/dL (ref 0.3–1.2)
Bilirubin, Direct: <0.2 mg/dL (ref 0.0–0.3)
TOTAL PROTEIN: 7.2 g/dL (ref 6.0–8.3)

## 2014-02-10 LAB — GLUCOSE, CAPILLARY
GLUCOSE-CAPILLARY: 112 mg/dL — AB (ref 70–99)
GLUCOSE-CAPILLARY: 162 mg/dL — AB (ref 70–99)
Glucose-Capillary: 110 mg/dL — ABNORMAL HIGH (ref 70–99)
Glucose-Capillary: 128 mg/dL — ABNORMAL HIGH (ref 70–99)
Glucose-Capillary: 131 mg/dL — ABNORMAL HIGH (ref 70–99)
Glucose-Capillary: 144 mg/dL — ABNORMAL HIGH (ref 70–99)

## 2014-02-10 MED ORDER — HYDRALAZINE HCL 25 MG PO TABS
25.0000 mg | ORAL_TABLET | Freq: Three times a day (TID) | ORAL | Status: DC
  Administered 2014-02-10 – 2014-02-11 (×3): 25 mg via ORAL
  Filled 2014-02-10 (×6): qty 1

## 2014-02-10 MED ORDER — HYDRALAZINE HCL 20 MG/ML IJ SOLN
5.0000 mg | Freq: Once | INTRAMUSCULAR | Status: AC
  Administered 2014-02-10: 5 mg via INTRAVENOUS

## 2014-02-10 MED ORDER — HYDRALAZINE HCL 20 MG/ML IJ SOLN
10.0000 mg | Freq: Three times a day (TID) | INTRAMUSCULAR | Status: DC
  Administered 2014-02-10: 10 mg via INTRAVENOUS

## 2014-02-10 MED ORDER — AMLODIPINE BESYLATE 10 MG PO TABS
10.0000 mg | ORAL_TABLET | Freq: Every day | ORAL | Status: DC
  Administered 2014-02-10 – 2014-02-13 (×4): 10 mg via ORAL
  Filled 2014-02-10 (×4): qty 1

## 2014-02-10 NOTE — Evaluation (Addendum)
Clinical/Bedside Swallow Evaluation Patient Details  Name: Karen Mcknight MRN: [id number] Date of Birth: 24-Oct-1941  Today's Date: 02/10/2014 Time: 1000-1030 SLP Time Calculation (min): 30 min  Past Medical History:  Past Medical History  Diagnosis Date  . Gout   . Hypertension   . Tremors of nervous system   . Chicken pox   . Chronic kidney disease (CKD), stage IV (severe)   . Type II diabetes mellitus   . High cholesterol   . GERD (gastroesophageal reflux disease)   . Anemia   . History of blood transfusion   . Dementia     "dx'd today" (02/08/2014)  . Arthritis     "?knee" (02/08/2014)  . Aphasia 02/08/2014    Hattie Perch/notes 02/08/2014   Past Surgical History:  Past Surgical History  Procedure Laterality Date  . Cholecystectomy    . Appendectomy     HPI:  Patient with PMH of DM, HTN and CKD; presented to PCP's office with a two-week history of worsening confusion expressive aphasia. Patient has recently moved from [place] live with her family after sustaining a fall at home 2 weeks ago with LOC ~ 4 hours.  Has also had systemic weakness and debility. Patient was seen at [facility] ER a week ago and diagnosed with renal insufficiency, cellulitis, fall and minor head injury.  CT at that time was negative for acute cerebral event. Family states patient is sometime hard to rouse to alertness. State patient is unable to respond sometimes, although she was completely coherent a few weeks ago. Patient has not had insulin in over a week since moving from SNF in [place] live with her family.     MRI brain-->T2 hyperintensities L>R parieto-occipital lobe, neg for acute infarct; scattered microhemorrhages with evidence of prior SAH.  BSE indicated per Stroke Protocol.     Assessment / Plan / Recommendation Clinical Impression  BSE completed.  Required total assist to administer all PO's.   Oropharyngeal swallow functional for thin liquid by cup/straw and puree consistency.  Mastication extended  and ineffective with soft and regular solids due to weakness and poor oral awareness.   Recommend to proceed with dysphagia 1 diet consistency (puree) and thin liquids.  Requires full supervision and assist with all PO's.  No outward clinical s/s of aspiration noted but risk remains secondary to current cognitive status.  ST to follow in acute care setting for diet tolerance and possible advancement.      Aspiration Risk  Moderate    Diet Recommendation Dysphagia 1 (Puree)   Liquid Administration via: Cup;Straw Medication Administration: Crushed with puree Supervision: Full supervision/cueing for compensatory strategies;Staff to assist with self feeding Compensations: Slow rate;Small sips/bites Postural Changes and/or Swallow Maneuvers: Seated upright 90 degrees;Upright 30-60 min after meal    Other  Recommendations Oral Care Recommendations: Oral care Q4 per protocol Other Recommendations: Clarify dietary restrictions   Follow Up Recommendations  Skilled Nursing facility    Frequency and Duration min 2x/week  2 weeks       SLP Swallow Goals Please refer to Care Plans  Swallow Study Prior Functional Status:   Recently moved from [place] live with family.      General Date of Onset: 02/08/14 HPI: Patient with PMH of DM, HTN and CKD; presented to PCP's office with a two-week history of worsening confusion expressive aphasia.  Type of Study: Bedside swallow evaluation Diet Prior to this Study: NPO Temperature Spikes Noted: No Respiratory Status: Room air History of Recent Intubation:  No Behavior/Cognition: Alert;Confused;Distractible;Requires cueing Oral Cavity - Dentition: Adequate natural dentition Self-Feeding Abilities: Total assist Patient Positioning: Upright in bed Baseline Vocal Quality: Low vocal intensity Volitional Cough: Cognitively unable to elicit Volitional Swallow: Unable to elicit    Oral/Motor/Sensory Function Overall Oral Motor/Sensory Function: Appears  within functional limits for tasks assessed   Ice Chips Ice chips: Not tested   Thin Liquid Thin Liquid: Within functional limits Presentation: Cup;Straw    Nectar Thick Nectar Thick Liquid: Not tested   Honey Thick Honey Thick Liquid: Not tested   Puree Puree: Within functional limits   Solid   GO    Solid: Impaired Oral Phase Impairments: Poor awareness of bolus;Impaired anterior to posterior transit Oral Phase Functional Implications: Oral holding      Moreen Fowler MS, [name] [phone] [facility] 02/10/2014,10:37 AM

## 2014-02-10 NOTE — Progress Notes (Signed)
Orthopedic Tech Progress Note Patient Details:  Cindee LameJulienne Lorek Apr 19, 1941 [id number]  Ortho Devices Type of Ortho Device: CAM walker Ortho Device/Splint Location: RLE Ortho Device/Splint Interventions: Application;Ordered   Jennye MoccasinHughes, Stacyann Mcconaughy Craig 02/10/2014, 4:45 PM

## 2014-02-10 NOTE — Progress Notes (Signed)
[facility] PROGRESS NOTE  Filomena Pokorney ZOX:[id number] DOB: 04-12-1941 DOA: 02/08/2014 PCP: Pcp Not In System  Assessment/Plan: Acute encephalopathy  -much improved today, but not at baseline per son -Multifactorial including PRES, hypercalcemia, renal failure  -check ammonia--38  -MRI brain-->T2 hyperintensities L>R parieto-occipital lobe, neg for acute infarct; scattered microhemorrhages with evidence of prior SAH  -TSH 0.792  -RPR nonreactive  -Serum B12--654  -Check RBC folate--pending -HIV--neg -Chest x-ray  -Urinalysis -Urine drug screen--+barbituate (on mysoline) Hypercalcemia  -Corrected calcium 11.68  -Intact PTH-pending -Ionized calcium--1.47  -IV fluids  Globulin Gap  -Check SPEP and UPEP  Diabetes mellitus type 2  -Hemoglobin A1c 6.8  -d/c levemir for now as pt has very little po intake  -continue novolog sliding scale  AKI -improving with IVF -Cannot make a diagnosis of CKD without prior records  -I have asked the patient's son to bring in her prior records  -IV fluids  -unclear baseline bilateral foot pain  -02/02/2014 x-ray of the right ankle reveals a small avulsion fracture lateral malleolus  -X-ray left foot negative for fracture  -Repeat x-ray right ankle--small avulsion fx of lateral malleolus -spoke with Dr. Magnus Ivan (ortho)--no surgery necessary--WBAT -CAM boot if pt can tolerate  Family Communication: Updated son on the telephone  Disposition Plan: Home when medically stable          Procedures/Studies: Dg Chest 2 View  01/31/2014   CLINICAL DATA:  Bilateral foot swelling and leg swelling for 6 days, fell 2 weeks ago  EXAM: CHEST  2 VIEW  COMPARISON:  None.  FINDINGS: Heart size is upper normal. Vascular pattern is normal. Lungs are clear. No effusion or or pneumothorax. Bony thorax intact.  IMPRESSION: No acute findings   Electronically Signed   By: Esperanza Heir M.D.   On: 01/31/2014 17:08   Dg Wrist Complete Left  01/31/2014    CLINICAL DATA:  Larey Seat 2 weeks ago with wrist pain  EXAM: LEFT WRIST - COMPLETE 3+ VIEW  COMPARISON:  None.  FINDINGS: Old fracture ulnar styloid process, corticated and mildly displaced. Triangular fibrocartilage calcification noted. No evidence of acute fracture or dislocation.  IMPRESSION: No acute findings   Electronically Signed   By: Esperanza Heir M.D.   On: 01/31/2014 17:07   Dg Ankle Complete Right  02/09/2014   CLINICAL DATA:  Avulsion fracture involving the tip of the lateral malleolus.  EXAM: RIGHT ANKLE - COMPLETE 3+ VIEW  COMPARISON:  Right ankle x-rays 1 week ago on 02/02/2014.  FINDINGS: Small avulsion fracture involving the tip of the lateral malleolus as noted previously. No new fractures. Lateral soft tissue swelling. Ankle mortise intact with well preserved joint space. Osseous demineralization. No visible joint effusion.  IMPRESSION: Small avulsion fracture involving the tip of the lateral malleolus as noted previously. No new abnormalities.   Electronically Signed   By: Hulan Saas M.D.   On: 02/09/2014 21:20   Dg Ankle Complete Right  02/02/2014   CLINICAL DATA:  Fall pain swelling  EXAM: RIGHT ANKLE - COMPLETE 3+ VIEW  COMPARISON:  None.  FINDINGS: Significant diffuse soft tissue swelling. Tiny bony fragment off of the tip of the lateral malleolus. The mortise is intact.  IMPRESSION: Significant soft tissue swelling consistent with sprain. There also appears to be a tiny avulsion fracture off of the tip of the lateral malleolus.   Electronically Signed   By: Esperanza Heir M.D.   On: 02/02/2014 12:41   Ct Head Wo Contrast  01/31/2014   CLINICAL  DATA:  Persistent headaches after falling 2 weeks ago.  EXAM: CT HEAD WITHOUT CONTRAST  TECHNIQUE: Contiguous axial images were obtained from the base of the skull through the vertex without intravenous contrast.  COMPARISON:  None.  FINDINGS: There is no evidence of acute intracranial hemorrhage, mass lesion, brain edema or extra-axial  fluid collection. The ventricles and subarachnoid spaces are appropriately sized for age. There is no CT evidence of acute cortical infarction. Mild intracranial vascular calcifications are noted.  The visualized paranasal sinuses, mastoid air cells and middle ears are clear. The calvarium is intact.  IMPRESSION: Unremarkable noncontrast head CT for age.   Electronically Signed   By: Roxy Horseman M.D.   On: 01/31/2014 17:10   Mr Brain Wo Contrast  02/09/2014   CLINICAL DATA:  Confusion.  Altered mental status.  EXAM: MRI HEAD WITHOUT CONTRAST  TECHNIQUE: Multiplanar, multiecho pulse sequences of the brain and surrounding structures were obtained without intravenous contrast.  COMPARISON:  Head CT 01/31/2014  FINDINGS: There is no acute infarct. Susceptibility artifact is present in multiple left greater than right parietal, posterior left frontal, and bilateral occipital lobe sulci consistent with prior subarachnoid hemorrhage. There are also scattered, small foci of microhemorrhage in cortical and subcortical locations within both cerebral hemispheres, predominantly posteriorly, and within the cerebellum. There are patchy periventricular white matter T2 hyperintensities. There are also patchy T2 hyperintensities within the left greater than right parietal and occipital lobes which appear largely subcortical but with some cortical involvement as well. Small subcortical T2 hyperintensity is also present in the left frontal lobe near the vertex. There is no evidence of mass, midline shift, or extra-axial fluid collection.  Marrow signal in the clivus is slightly heterogeneous, nonspecific. Major intracranial vascular flow voids are unremarkable. Cerebral volume is within normal limits for age. Prior bilateral cataract surgery is noted. Paranasal sinuses and mastoid air cells are clear.  IMPRESSION: 1. No evidence of acute infarct. 2. Patchy T2 hyperintensities predominantly involving the left greater than right  parietal and occipital lobes. This is nonspecific, with considerations including PRES and sequelae of prior ischemia. 3. Scattered foci of microhemorrhage within the left greater than right cerebral hemispheres and to a lesser extent cerebellum, predominantly posteriorly. Microhemorrhages can be seen with PRES as well as cerebral amyloid angiopathy and prior infarcts. 4. 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   Electronically Signed   By: Sebastian Ache   On: 02/09/2014 09:28   US Venous Img Lower Unilateral Right  01/31/2014   CLINICAL DATA:  Right lower leg swelling. Fell 6 days ago. Patient unable to provide months history at this time.  EXAM: RIGHT LOWER EXTREMITY VENOUS DOPPLER ULTRASOUND  TECHNIQUE: Gray-scale sonography with graded compression, as well as color Doppler and duplex ultrasound, were performed to evaluate the deep venous system from the level of the common femoral vein through the popliteal and proximal calf veins. Spectral Doppler was utilized to evaluate flow at rest and with distal augmentation maneuvers.  COMPARISON:  None.  FINDINGS: Thrombus within deep veins:  None visualized.  Compressibility of deep veins:  Normal.  Duplex waveform respiratory phasicity:  Normal.  Duplex waveform response to augmentation:  Normal.  Venous reflux:  None visualized.  The peroneal vein is not well visualized.  Other findings: Subcutaneous edema is noted in the mid to lower calf region and ankle.  IMPRESSION: 1. No evidence of right lower extremity deep venous thrombosis. 2. Subcutaneous edema of the right lower extremity  at the level of the calf and ankle.   Electronically Signed   By: Britta Mccreedy M.D.   On: 01/31/2014 16:55   Dg Chest Port 1 View  02/09/2014   CLINICAL DATA:  Hypoxemia.  EXAM: PORTABLE CHEST - 1 VIEW  COMPARISON:  January 31, 2014.  FINDINGS: Stable cardiomediastinal silhouette. No pneumothorax or pleural effusion is noted. Bony thorax is intact.  No acute pulmonary disease is noted.  IMPRESSION: No acute cardiopulmonary abnormality seen.   Electronically Signed   By: Roque Lias M.D.   On: 02/09/2014 14:08   Dg Foot 2 Views Left  02/08/2014   CLINICAL DATA:  Left foot pain.  EXAM: LEFT FOOT - 2 VIEW  COMPARISON:  None.  FINDINGS: Arthritic changes in the left first MTP joint with joint space narrowing. Probable erosions. Overlying soft tissue swelling.  No fracture, subluxation or dislocation. Normal bone mineralization.  IMPRESSION: Arthritic changes in the left first MTP joint with joint space narrowing and erosions with overlying soft tissue swelling. Suspect gout.   Electronically Signed   By: Charlett Nose M.D.   On: 02/08/2014 22:51   Dg Foot 2 Views Right  02/09/2014   CLINICAL DATA:  Larey Seat 1 week ago with avulsion fracture arising from the lateral malleolus of the ankle. Right foot pain and swelling.  EXAM: RIGHT FOOT - 2 VIEW  COMPARISON:  None.  FINDINGS: No evidence of acute or subacute fracture. Osseous demineralization. Subchondral cysts involving the head of the first metatarsal. Joint space narrowing involving the IP joints of multiple toes. Mild dorsal soft tissue swelling.  IMPRESSION: No acute or subacute osseous abnormality.  Mild osteoarthritis.   Electronically Signed   By: Hulan Saas M.D.   On: 02/09/2014 21:22         Subjective: Patient is more alert today. She remained pleasantly confused. She is answering questions properly. Denies fevers, chills, chest pain, shortness breath, abdominal pain, vomiting, diarrhea   Objective: Filed Vitals:   02/10/14 0934 02/10/14 1319 02/10/14 1505 02/10/14 1710  BP: 191/75 161/71 175/71 173/73  Pulse: 74 88 83 85  Temp: 98 F (36.7 C) 98.4 F (36.9 C)  98.5 F (36.9 C)  TempSrc:      Resp: 18 16  18   Height:      Weight:      SpO2: 96% 95%  94%    Intake/Output Summary (Last 24 hours) at 02/10/14 1737 Last data filed at 02/10/14 0934  Gross per 24 hour   Intake 893.75 ml  Output      0 ml  Net 893.75 ml   Weight change: -1.95 kg (-4 lb 4.8 oz) Exam:   General:  Pt is alert, follows commands appropriately, not in acute distress  HEENT: No icterus, No thrush,  [place]/AT  Cardiovascular: RRR, S1/S2, no rubs, no gallops  Respiratory: CTA bilaterally, no wheezing, no crackles, no rhonchi  Abdomen: Soft/+BS, non tender, non distended, no guarding  Extremities: trace LE edema, No lymphangitis, No petechiae, No rashes, no synovitis  Data Reviewed: Basic Metabolic Panel:  Recent Labs Lab 02/08/14 1850 02/09/14 0332  NA 142 140  K 4.9 4.7  CL 102 102  CO2 26 24  GLUCOSE 121* 121*  BUN 26* 25*  CREATININE 1.73* 1.76*  CALCIUM 10.9* 10.8*  MG 1.8  --   PHOS 2.7  --    Liver Function Tests:  Recent Labs Lab 02/08/14 1850 02/09/14 0332  AST 33 26  ALT 43* 34  ALKPHOS 148* 128*  BILITOT 0.3 0.3  PROT 8.3 7.1  ALBUMIN 3.3* 2.9*   No results found for this basename: LIPASE, AMYLASE,  in the last 168 hours  Recent Labs Lab 02/09/14 1915  AMMONIA 38   CBC:  Recent Labs Lab 02/08/14 1850 02/09/14 0332  WBC 10.8* 9.2  HGB 9.6* 8.7*  HCT 29.2* 26.7*  MCV 90.7 91.4  PLT 482* 462*   Cardiac Enzymes: No results found for this basename: CKTOTAL, CKMB, CKMBINDEX, TROPONINI,  in the last 168 hours BNP: No components found with this basename: POCBNP,  CBG:  Recent Labs Lab 02/10/14 0011 02/10/14 0425 02/10/14 0735 02/10/14 1123 02/10/14 1710  GLUCAP 110* 112* 128* 131* 144*    No results found for this or any previous visit (from the past 240 hour(s)).   Scheduled Meds: . amLODipine  10 mg Oral Daily  . atorvastatin  20 mg Oral QHS  . enoxaparin  40 mg Subcutaneous Q24H  . hydrALAZINE  25 mg Oral 3 times per day  . insulin aspart  0-9 Units Subcutaneous Q4H  . pantoprazole (PROTONIX) IV  40 mg Intravenous Q24H  . sodium bicarbonate  650 mg Oral BID  . sodium chloride  3 mL Intravenous Q12H    Continuous Infusions: . sodium chloride Stopped (02/09/14 1256)  . sodium chloride 75 mL/hr at 02/10/14 0917     Martrell Eguia, DO  [facility] Pager [phone]  If 7PM-7AM, please contact night-coverage [email or web address] Password [email or web address] 02/10/2014, 5:37 PM   LOS: 2 days

## 2014-02-10 NOTE — Progress Notes (Signed)
BP 191/75, HR 74. Dr. Arbutus Leasat notified. New orders received.  Kathlene NovemberEckelmann, Hutchinson Isenberg CaledoniaE[place]en

## 2014-02-10 NOTE — Progress Notes (Signed)
Pt c/o urinary frequency and pressure. Dr. Arbutus Leasat aware. New orders received. Bladder scan >400. Received order to place foley catheter.   Kathlene NovemberEckelmann, Joziyah Roblero [place]

## 2014-02-11 LAB — COMPREHENSIVE METABOLIC PANEL
ALBUMIN: 2.9 g/dL — AB (ref 3.5–5.2)
ALK PHOS: 122 U/L — AB (ref 39–117)
ALT: 23 U/L (ref 0–35)
AST: 16 U/L (ref 0–37)
BILIRUBIN TOTAL: 0.3 mg/dL (ref 0.3–1.2)
BUN: 21 mg/dL (ref 6–23)
CO2: 21 meq/L (ref 19–32)
Calcium: 10.2 mg/dL (ref 8.4–10.5)
Chloride: 105 mEq/L (ref 96–112)
Creatinine, Ser: 1.45 mg/dL — ABNORMAL HIGH (ref 0.50–1.10)
GFR calc Af Amer: 40 mL/min — ABNORMAL LOW (ref >90)
GFR, EST NON AFRICAN AMERICAN: 35 mL/min — AB (ref >90)
Glucose, Bld: 137 mg/dL — ABNORMAL HIGH (ref 70–99)
POTASSIUM: 4.4 meq/L (ref 3.7–5.3)
Sodium: 140 mEq/L (ref 137–147)
Total Protein: 7.3 g/dL (ref 6.0–8.3)

## 2014-02-11 LAB — GLUCOSE, CAPILLARY
GLUCOSE-CAPILLARY: 124 mg/dL — AB (ref 70–99)
Glucose-Capillary: 113 mg/dL — ABNORMAL HIGH (ref 70–99)
Glucose-Capillary: 115 mg/dL — ABNORMAL HIGH (ref 70–99)
Glucose-Capillary: 120 mg/dL — ABNORMAL HIGH (ref 70–99)
Glucose-Capillary: 123 mg/dL — ABNORMAL HIGH (ref 70–99)
Glucose-Capillary: 127 mg/dL — ABNORMAL HIGH (ref 70–99)
Glucose-Capillary: 150 mg/dL — ABNORMAL HIGH (ref 70–99)

## 2014-02-11 LAB — URINE CULTURE
Colony Count: NO GROWTH
Culture: NO GROWTH

## 2014-02-11 LAB — FOLATE RBC: RBC Folate: 1233 ng/mL — ABNORMAL HIGH (ref >280)

## 2014-02-11 LAB — CALCIUM, IONIZED: Calcium, Ion: 1.43 mmol/L — ABNORMAL HIGH (ref 1.13–1.30)

## 2014-02-11 LAB — CBC
HEMATOCRIT: 29.3 % — AB (ref 36.0–46.0)
Hemoglobin: 9.4 g/dL — ABNORMAL LOW (ref 12.0–15.0)
MCH: 29.2 pg (ref 26.0–34.0)
MCHC: 32.1 g/dL (ref 30.0–36.0)
MCV: 91 fL (ref 78.0–100.0)
PLATELETS: 461 10*3/uL — AB (ref 150–400)
RBC: 3.22 MIL/uL — ABNORMAL LOW (ref 3.87–5.11)
RDW: 13.5 % (ref 11.5–15.5)
WBC: 11.9 10*3/uL — ABNORMAL HIGH (ref 4.0–10.5)

## 2014-02-11 MED ORDER — CALCITONIN (SALMON) 200 UNIT/ML IJ SOLN
300.0000 [IU] | Freq: Two times a day (BID) | INTRAMUSCULAR | Status: AC
  Administered 2014-02-11 – 2014-02-12 (×3): 300 [IU] via SUBCUTANEOUS
  Filled 2014-02-11 (×5): qty 1.5

## 2014-02-11 MED ORDER — PANTOPRAZOLE SODIUM 40 MG PO TBEC
40.0000 mg | DELAYED_RELEASE_TABLET | Freq: Every day | ORAL | Status: DC
  Administered 2014-02-12 – 2014-02-13 (×2): 40 mg via ORAL
  Filled 2014-02-11: qty 1

## 2014-02-11 MED ORDER — ENOXAPARIN SODIUM 30 MG/0.3ML ~~LOC~~ SOLN
30.0000 mg | SUBCUTANEOUS | Status: DC
  Administered 2014-02-11 – 2014-02-13 (×2): 30 mg via SUBCUTANEOUS
  Filled 2014-02-11: qty 0.3
  Filled 2014-02-11: qty 0.4
  Filled 2014-02-11: qty 0.3

## 2014-02-11 MED ORDER — INSULIN ASPART 100 UNIT/ML ~~LOC~~ SOLN
0.0000 [IU] | Freq: Three times a day (TID) | SUBCUTANEOUS | Status: DC
  Administered 2014-02-11: 1 [IU] via SUBCUTANEOUS
  Administered 2014-02-12: 2 [IU] via SUBCUTANEOUS
  Administered 2014-02-13: 1 [IU] via SUBCUTANEOUS
  Administered 2014-02-13: 2 [IU] via SUBCUTANEOUS

## 2014-02-11 MED ORDER — HYDRALAZINE HCL 50 MG PO TABS
50.0000 mg | ORAL_TABLET | Freq: Three times a day (TID) | ORAL | Status: DC
  Administered 2014-02-11 – 2014-02-13 (×6): 50 mg via ORAL
  Filled 2014-02-11 (×8): qty 1

## 2014-02-11 NOTE — Progress Notes (Addendum)
[facility] PROGRESS NOTE  Cindee LameJulienne Bry ZOX:[id number] DOB: May 03, 1941 DOA: 02/08/2014 PCP: Pcp Not In System  Assessment/Plan: Acute encephalopathy  -continued improvement today -Multifactorial including PRES, hypercalcemia, renal failure  -check ammonia--38  -MRI brain-->T2 hyperintensities L>R parieto-occipital lobe, neg for acute infarct; scattered microhemorrhages with evidence of prior SAH  -TSH 0.792  -RPR nonreactive  -Serum B12--654  -Check RBC folate--1233  -HIV--neg  -Chest x-ray--nonacute  -Urinalysis  -Urine drug screen--+barbituate (on mysoline)  Hypercalcemia  -Corrected calcium 11.68  -Intact PTH-pending  -Ionized calcium--1.47  -IV fluids--increase to 100cc/hr -start calcitonin [place] x 4 doses -TSH--0.792  -check vitamin D levels and vitamin A level Globulin Gap  -Check SPEP and UPEP--pending  Diabetes mellitus type 2  -Hemoglobin A1c 6.8  -d/c levemir for now as pt has very little po intake  -continue novolog sliding scale  AKI  -improving with IVF--plateau at 1.4 range  -Cannot make a diagnosis of CKD without prior records  -I have asked the patient's son to bring in her prior records  -IV fluids  -unclear baseline  bilateral foot pain  -02/02/2014 x-ray of the right ankle reveals a small avulsion fracture lateral malleolus  -X-ray left foot negative for fracture  -Repeat x-ray right ankle--small avulsion fx of lateral malleolus  -spoke with Dr. Magnus IvanBlackman (ortho)--no surgery necessary--WBAT  -CAM boot if pt can tolerate  Hypertension -Continue amlodipine -Increase hydralazine to 50 mg every 8 hours Deconditioning -PT evaluation Family Communication: Updated son at bedside Disposition Plan: Home when medically stable        Procedures/Studies: Dg Chest 2 View  01/31/2014   CLINICAL DATA:  Bilateral foot swelling and leg swelling for 6 days, fell 2 weeks ago  EXAM: CHEST  2 VIEW  COMPARISON:  None.  FINDINGS: Heart size is upper normal.  Vascular pattern is normal. Lungs are clear. No effusion or or pneumothorax. Bony thorax intact.  IMPRESSION: No acute findings   Electronically Signed   By: Esperanza Heiraymond  Rubner M.D.   On: 01/31/2014 17:08   Dg Wrist Complete Left  01/31/2014   CLINICAL DATA:  Larey SeatFell 2 weeks ago with wrist pain  EXAM: LEFT WRIST - COMPLETE 3+ VIEW  COMPARISON:  None.  FINDINGS: Old fracture ulnar styloid process, corticated and mildly displaced. Triangular fibrocartilage calcification noted. No evidence of acute fracture or dislocation.  IMPRESSION: No acute findings   Electronically Signed   By: Esperanza Heiraymond  Rubner M.D.   On: 01/31/2014 17:07   Dg Ankle Complete Right  02/09/2014   CLINICAL DATA:  Avulsion fracture involving the tip of the lateral malleolus.  EXAM: RIGHT ANKLE - COMPLETE 3+ VIEW  COMPARISON:  Right ankle x-rays 1 week ago on 02/02/2014.  FINDINGS: Small avulsion fracture involving the tip of the lateral malleolus as noted previously. No new fractures. Lateral soft tissue swelling. Ankle mortise intact with well preserved joint space. Osseous demineralization. No visible joint effusion.  IMPRESSION: Small avulsion fracture involving the tip of the lateral malleolus as noted previously. No new abnormalities.   Electronically Signed   By: Hulan Saashomas  Lawrence M.D.   On: 02/09/2014 21:20   Dg Ankle Complete Right  02/02/2014   CLINICAL DATA:  Fall pain swelling  EXAM: RIGHT ANKLE - COMPLETE 3+ VIEW  COMPARISON:  None.  FINDINGS: Significant diffuse soft tissue swelling. Tiny bony fragment off of the tip of the lateral malleolus. The mortise is intact.  IMPRESSION: Significant soft tissue swelling consistent with sprain. There also appears to be a tiny avulsion fracture off  of the tip of the lateral malleolus.   Electronically Signed   By: Esperanza Heir M.D.   On: 02/02/2014 12:41   Ct Head Wo Contrast  01/31/2014   CLINICAL DATA:  Persistent headaches after falling 2 weeks ago.  EXAM: CT HEAD WITHOUT CONTRAST   TECHNIQUE: Contiguous axial images were obtained from the base of the skull through the vertex without intravenous contrast.  COMPARISON:  None.  FINDINGS: There is no evidence of acute intracranial hemorrhage, mass lesion, brain edema or extra-axial fluid collection. The ventricles and subarachnoid spaces are appropriately sized for age. There is no CT evidence of acute cortical infarction. Mild intracranial vascular calcifications are noted.  The visualized paranasal sinuses, mastoid air cells and middle ears are clear. The calvarium is intact.  IMPRESSION: Unremarkable noncontrast head CT for age.   Electronically Signed   By: Roxy Horseman M.D.   On: 01/31/2014 17:10   Mr Brain Wo Contrast  02/09/2014   CLINICAL DATA:  Confusion.  Altered mental status.  EXAM: MRI HEAD WITHOUT CONTRAST  TECHNIQUE: Multiplanar, multiecho pulse sequences of the brain and surrounding structures were obtained without intravenous contrast.  COMPARISON:  Head CT 01/31/2014  FINDINGS: There is no acute infarct. Susceptibility artifact is present in multiple left greater than right parietal, posterior left frontal, and bilateral occipital lobe sulci consistent with prior subarachnoid hemorrhage. There are also scattered, small foci of microhemorrhage in cortical and subcortical locations within both cerebral hemispheres, predominantly posteriorly, and within the cerebellum. There are patchy periventricular white matter T2 hyperintensities. There are also patchy T2 hyperintensities within the left greater than right parietal and occipital lobes which appear largely subcortical but with some cortical involvement as well. Small subcortical T2 hyperintensity is also present in the left frontal lobe near the vertex. There is no evidence of mass, midline shift, or extra-axial fluid collection.  Marrow signal in the clivus is slightly heterogeneous, nonspecific. Major intracranial vascular flow voids are unremarkable. Cerebral volume is  within normal limits for age. Prior bilateral cataract surgery is noted. Paranasal sinuses and mastoid air cells are clear.  IMPRESSION: 1. No evidence of acute infarct. 2. Patchy T2 hyperintensities predominantly involving the left greater than right parietal and occipital lobes. This is nonspecific, with considerations including PRES and sequelae of prior ischemia. 3. Scattered foci of microhemorrhage within the left greater than right cerebral hemispheres and to a lesser extent cerebellum, predominantly posteriorly. Microhemorrhages can be seen with PRES as well as cerebral amyloid angiopathy and prior infarcts. 4. 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   Electronically Signed   By: Sebastian Ache   On: 02/09/2014 09:28   US Venous Img Lower Unilateral Right  01/31/2014   CLINICAL DATA:  Right lower leg swelling. Fell 6 days ago. Patient unable to provide months history at this time.  EXAM: RIGHT LOWER EXTREMITY VENOUS DOPPLER ULTRASOUND  TECHNIQUE: Gray-scale sonography with graded compression, as well as color Doppler and duplex ultrasound, were performed to evaluate the deep venous system from the level of the common femoral vein through the popliteal and proximal calf veins. Spectral Doppler was utilized to evaluate flow at rest and with distal augmentation maneuvers.  COMPARISON:  None.  FINDINGS: Thrombus within deep veins:  None visualized.  Compressibility of deep veins:  Normal.  Duplex waveform respiratory phasicity:  Normal.  Duplex waveform response to augmentation:  Normal.  Venous reflux:  None visualized.  The peroneal vein is not well visualized.  Other  findings: Subcutaneous edema is noted in the mid to lower calf region and ankle.  IMPRESSION: 1. No evidence of right lower extremity deep venous thrombosis. 2. Subcutaneous edema of the right lower extremity at the level of the calf and ankle.   Electronically Signed   By: Britta Mccreedy M.D.   On: 01/31/2014  16:55   Dg Chest Port 1 View  02/09/2014   CLINICAL DATA:  Hypoxemia.  EXAM: PORTABLE CHEST - 1 VIEW  COMPARISON:  January 31, 2014.  FINDINGS: Stable cardiomediastinal silhouette. No pneumothorax or pleural effusion is noted. Bony thorax is intact. No acute pulmonary disease is noted.  IMPRESSION: No acute cardiopulmonary abnormality seen.   Electronically Signed   By: Roque Lias M.D.   On: 02/09/2014 14:08   Dg Foot 2 Views Left  02/08/2014   CLINICAL DATA:  Left foot pain.  EXAM: LEFT FOOT - 2 VIEW  COMPARISON:  None.  FINDINGS: Arthritic changes in the left first MTP joint with joint space narrowing. Probable erosions. Overlying soft tissue swelling.  No fracture, subluxation or dislocation. Normal bone mineralization.  IMPRESSION: Arthritic changes in the left first MTP joint with joint space narrowing and erosions with overlying soft tissue swelling. Suspect gout.   Electronically Signed   By: Charlett Nose M.D.   On: 02/08/2014 22:51   Dg Foot 2 Views Right  02/09/2014   CLINICAL DATA:  Larey Seat 1 week ago with avulsion fracture arising from the lateral malleolus of the ankle. Right foot pain and swelling.  EXAM: RIGHT FOOT - 2 VIEW  COMPARISON:  None.  FINDINGS: No evidence of acute or subacute fracture. Osseous demineralization. Subchondral cysts involving the head of the first metatarsal. Joint space narrowing involving the IP joints of multiple toes. Mild dorsal soft tissue swelling.  IMPRESSION: No acute or subacute osseous abnormality.  Mild osteoarthritis.   Electronically Signed   By: Hulan Saas M.D.   On: 02/09/2014 21:22         Subjective: Patient is pleasantly confused. She denies any fevers, chills, chest discomfort, shortness breath, vomiting, diarrhea, abdominal pain., Headache the  Objective: Filed Vitals:   02/10/14 2015 02/11/14 0405 02/11/14 0811 02/11/14 1307  BP: 145/65 167/75 160/65 154/54  Pulse: 91 86 84 80  Temp: 99 F (37.2 C) 98.4 F (36.9 C) 99 F  (37.2 C) 99 F (37.2 C)  TempSrc: Oral Oral    Resp: 18 18 18 17   Height:      Weight: 81.874 kg (180 lb 8 oz)     SpO2: 96% 96% 96% 96%    Intake/Output Summary (Last 24 hours) at 02/11/14 1406 Last data filed at 02/11/14 1404  Gross per 24 hour  Intake 1916.25 ml  Output   1375 ml  Net 541.25 ml   Weight change: 4.037 kg (8 lb 14.4 oz) Exam:   General:  Pt is alert, follows commands appropriately, not in acute distress  HEENT: No icterus, No thrush,  [place]/AT  Cardiovascular: RRR, S1/S2, no rubs, no gallops  Respiratory: CTA bilaterally, no wheezing, no crackles, no rhonchi  Abdomen: Soft/+BS, non tender, non distended, no guarding  Extremities: trace le edema, No lymphangitis, No petechiae, No rashes, no synovitis  Data Reviewed: Basic Metabolic Panel:  Recent Labs Lab 02/08/14 1850 02/09/14 0332 02/10/14 1615 02/11/14 0825  NA 142 140 139 140  K 4.9 4.7 4.5 4.4  CL 102 102 104 105  CO2 26 24 20 21   GLUCOSE 121* 121* 156* 137*  BUN 26* 25* 24* 21  CREATININE 1.73* 1.76* 1.46* 1.45*  CALCIUM 10.9* 10.8* 10.1 10.2  MG 1.8  --   --   --   PHOS 2.7  --   --   --    Liver Function Tests:  Recent Labs Lab 02/08/14 1850 02/09/14 0332 02/10/14 1615 02/11/14 0825  AST 33 26 21 16   ALT 43* 34 27 23  ALKPHOS 148* 128* 124* 122*  BILITOT 0.3 0.3 0.3 0.3  PROT 8.3 7.1 7.2 7.3  ALBUMIN 3.3* 2.9* 2.9* 2.9*   No results found for this basename: LIPASE, AMYLASE,  in the last 168 hours  Recent Labs Lab 02/09/14 1915  AMMONIA 38   CBC:  Recent Labs Lab 02/08/14 1850 02/09/14 0332 02/11/14 0825  WBC 10.8* 9.2 11.9*  HGB 9.6* 8.7* 9.4*  HCT 29.2* 26.7* 29.3*  MCV 90.7 91.4 91.0  PLT 482* 462* 461*   Cardiac Enzymes: No results found for this basename: CKTOTAL, CKMB, CKMBINDEX, TROPONINI,  in the last 168 hours BNP: No components found with this basename: POCBNP,  CBG:  Recent Labs Lab 02/11/14 0012 02/11/14 0358 02/11/14 0812 02/11/14 1058  02/11/14 1336  GLUCAP 115* 113* 127* 150* 124*    No results found for this or any previous visit (from the past 240 hour(s)).   Scheduled Meds: . amLODipine  10 mg Oral Daily  . atorvastatin  20 mg Oral QHS  . calcitonin  300 Units Subcutaneous Q12H  . enoxaparin  40 mg Subcutaneous Q24H  . hydrALAZINE  50 mg Oral 3 times per day  . insulin aspart  0-9 Units Subcutaneous TID WC  . [START ON 02/12/2014] pantoprazole  40 mg Oral Daily  . sodium bicarbonate  650 mg Oral BID  . sodium chloride  3 mL Intravenous Q12H   Continuous Infusions: . sodium chloride Stopped (02/09/14 1256)  . sodium chloride 75 mL/hr at 02/11/14 1009     Majour Frei, DO  [facility] Pager [phone]  If 7PM-7AM, please contact night-coverage [email or web address] Password [email or web address] 02/11/2014, 2:06 PM   LOS: 3 days

## 2014-02-12 LAB — BASIC METABOLIC PANEL
BUN: 18 mg/dL (ref 6–23)
CO2: 19 mEq/L (ref 19–32)
CREATININE: 1.43 mg/dL — AB (ref 0.50–1.10)
Calcium: 9.1 mg/dL (ref 8.4–10.5)
Chloride: 108 mEq/L (ref 96–112)
GFR calc non Af Amer: 35 mL/min — ABNORMAL LOW (ref >90)
GFR, EST AFRICAN AMERICAN: 41 mL/min — AB (ref >90)
Glucose, Bld: 133 mg/dL — ABNORMAL HIGH (ref 70–99)
Potassium: 4.7 mEq/L (ref 3.7–5.3)
Sodium: 140 mEq/L (ref 137–147)

## 2014-02-12 LAB — GLUCOSE, CAPILLARY
GLUCOSE-CAPILLARY: 127 mg/dL — AB (ref 70–99)
GLUCOSE-CAPILLARY: 113 mg/dL — AB (ref 70–99)
Glucose-Capillary: 174 mg/dL — ABNORMAL HIGH (ref 70–99)
Glucose-Capillary: 131 mg/dL — ABNORMAL HIGH (ref 70–99)

## 2014-02-12 LAB — PARATHYROID HORMONE, INTACT (NO CA): PTH: 136.6 pg/mL — ABNORMAL HIGH (ref 14.0–72.0)

## 2014-02-12 MED ORDER — METOPROLOL TARTRATE 25 MG PO TABS
25.0000 mg | ORAL_TABLET | Freq: Two times a day (BID) | ORAL | Status: DC
  Administered 2014-02-12 – 2014-02-13 (×3): 25 mg via ORAL
  Filled 2014-02-12 (×4): qty 1

## 2014-02-12 MED ORDER — GLUCERNA SHAKE PO LIQD
237.0000 mL | Freq: Three times a day (TID) | ORAL | Status: DC
Start: 2014-02-12 — End: 2014-02-13
  Administered 2014-02-12 – 2014-02-13 (×4): 237 mL via ORAL

## 2014-02-12 NOTE — Progress Notes (Signed)
[facility] PROGRESS NOTE  Karen Mcknight ZOX:[id number] DOB: November 25, 1941 DOA: 02/08/2014 PCP: Pcp Not In System  Assessment/Plan: Acute encephalopathy  -continued improvement today  -Multifactorial including PRES, hypercalcemia, renal failure  -check ammonia--38  -MRI brain-->T2 hyperintensities L>R parieto-occipital lobe, neg for acute infarct; scattered microhemorrhages with evidence of prior SAH  -TSH 0.792  -RPR nonreactive  -Serum B12--654  -Check RBC folate--1233  -HIV--neg  -Chest x-ray--nonacute  -Urinalysis--no pyuria  -Urine drug screen--+barbituate (on mysoline)  Hypercalcemia - Most likely primary hyperparathyroidism--can be addressed as an outpatient -Corrected calcium 11.68  -Intact PTH-136.6 -Check urine calcium/creatinine ratio -IV fluids--increase to 100cc/hr  -start calcitonin [place] x 4 doses  -TSH--0.792  -check vitamin D levels and vitamin A level  Globulin Gap  -Check SPEP and UPEP--pending  Diabetes mellitus type 2  -Hemoglobin A1c 6.8  -d/c levemir for now as pt has very little po intake  -continue novolog sliding scale  Acute on Chronic Renal Failure (CKD 3) -improving with IVF--plateau at 1.4 range  -IV fluids  -unclear baseline, but it appears now serum creatinine plateau ~1.4 bilateral foot pain//Avulsion fracture of Right ankle -02/02/2014 x-ray of the right ankle reveals a small avulsion fracture lateral malleolus  -X-ray left foot negative for fracture  -Repeat x-ray right ankle--small avulsion fx of lateral malleolus  -spoke with Dr. Magnus Ivan (ortho)--no surgery necessary--WBAT  -CAM boot if pt can tolerate  Hypertension  -Continue amlodipine 10mg   -Increase hydralazine to 50 mg every 8 hours  -add metoprolol tartrate 25mg  bid Urinary retention -Will need a voiding trial prior to discharge Deconditioning  -PT evaluation-->SNF  Family Communication: Updated son at bedside  Disposition Plan:  SNF         Procedures/Studies: Dg Chest 2 View  01/31/2014   CLINICAL DATA:  Bilateral foot swelling and leg swelling for 6 days, fell 2 weeks ago  EXAM: CHEST  2 VIEW  COMPARISON:  None.  FINDINGS: Heart size is upper normal. Vascular pattern is normal. Lungs are clear. No effusion or or pneumothorax. Bony thorax intact.  IMPRESSION: No acute findings   Electronically Signed   By: Esperanza Heir M.D.   On: 01/31/2014 17:08   Dg Wrist Complete Left  01/31/2014   CLINICAL DATA:  Larey Seat 2 weeks ago with wrist pain  EXAM: LEFT WRIST - COMPLETE 3+ VIEW  COMPARISON:  None.  FINDINGS: Old fracture ulnar styloid process, corticated and mildly displaced. Triangular fibrocartilage calcification noted. No evidence of acute fracture or dislocation.  IMPRESSION: No acute findings   Electronically Signed   By: Esperanza Heir M.D.   On: 01/31/2014 17:07   Dg Ankle Complete Right  02/09/2014   CLINICAL DATA:  Avulsion fracture involving the tip of the lateral malleolus.  EXAM: RIGHT ANKLE - COMPLETE 3+ VIEW  COMPARISON:  Right ankle x-rays 1 week ago on 02/02/2014.  FINDINGS: Small avulsion fracture involving the tip of the lateral malleolus as noted previously. No new fractures. Lateral soft tissue swelling. Ankle mortise intact with well preserved joint space. Osseous demineralization. No visible joint effusion.  IMPRESSION: Small avulsion fracture involving the tip of the lateral malleolus as noted previously. No new abnormalities.   Electronically Signed   By: Hulan Saas M.D.   On: 02/09/2014 21:20   Dg Ankle Complete Right  02/02/2014   CLINICAL DATA:  Fall pain swelling  EXAM: RIGHT ANKLE - COMPLETE 3+ VIEW  COMPARISON:  None.  FINDINGS: Significant diffuse soft tissue swelling. Tiny bony fragment off of the tip of  the lateral malleolus. The mortise is intact.  IMPRESSION: Significant soft tissue swelling consistent with sprain. There also appears to be a tiny avulsion fracture off of the tip of  the lateral malleolus.   Electronically Signed   By: Esperanza Heir M.D.   On: 02/02/2014 12:41   Ct Head Wo Contrast  01/31/2014   CLINICAL DATA:  Persistent headaches after falling 2 weeks ago.  EXAM: CT HEAD WITHOUT CONTRAST  TECHNIQUE: Contiguous axial images were obtained from the base of the skull through the vertex without intravenous contrast.  COMPARISON:  None.  FINDINGS: There is no evidence of acute intracranial hemorrhage, mass lesion, brain edema or extra-axial fluid collection. The ventricles and subarachnoid spaces are appropriately sized for age. There is no CT evidence of acute cortical infarction. Mild intracranial vascular calcifications are noted.  The visualized paranasal sinuses, mastoid air cells and middle ears are clear. The calvarium is intact.  IMPRESSION: Unremarkable noncontrast head CT for age.   Electronically Signed   By: Roxy Horseman M.D.   On: 01/31/2014 17:10   Mr Brain Wo Contrast  02/09/2014   CLINICAL DATA:  Confusion.  Altered mental status.  EXAM: MRI HEAD WITHOUT CONTRAST  TECHNIQUE: Multiplanar, multiecho pulse sequences of the brain and surrounding structures were obtained without intravenous contrast.  COMPARISON:  Head CT 01/31/2014  FINDINGS: There is no acute infarct. Susceptibility artifact is present in multiple left greater than right parietal, posterior left frontal, and bilateral occipital lobe sulci consistent with prior subarachnoid hemorrhage. There are also scattered, small foci of microhemorrhage in cortical and subcortical locations within both cerebral hemispheres, predominantly posteriorly, and within the cerebellum. There are patchy periventricular white matter T2 hyperintensities. There are also patchy T2 hyperintensities within the left greater than right parietal and occipital lobes which appear largely subcortical but with some cortical involvement as well. Small subcortical T2 hyperintensity is also present in the left frontal lobe near the  vertex. There is no evidence of mass, midline shift, or extra-axial fluid collection.  Marrow signal in the clivus is slightly heterogeneous, nonspecific. Major intracranial vascular flow voids are unremarkable. Cerebral volume is within normal limits for age. Prior bilateral cataract surgery is noted. Paranasal sinuses and mastoid air cells are clear.  IMPRESSION: 1. No evidence of acute infarct. 2. Patchy T2 hyperintensities predominantly involving the left greater than right parietal and occipital lobes. This is nonspecific, with considerations including PRES and sequelae of prior ischemia. 3. Scattered foci of microhemorrhage within the left greater than right cerebral hemispheres and to a lesser extent cerebellum, predominantly posteriorly. Microhemorrhages can be seen with PRES as well as cerebral amyloid angiopathy and prior infarcts. 4. 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   Electronically Signed   By: Sebastian Ache   On: 02/09/2014 09:28   US Venous Img Lower Unilateral Right  01/31/2014   CLINICAL DATA:  Right lower leg swelling. Fell 6 days ago. Patient unable to provide months history at this time.  EXAM: RIGHT LOWER EXTREMITY VENOUS DOPPLER ULTRASOUND  TECHNIQUE: Gray-scale sonography with graded compression, as well as color Doppler and duplex ultrasound, were performed to evaluate the deep venous system from the level of the common femoral vein through the popliteal and proximal calf veins. Spectral Doppler was utilized to evaluate flow at rest and with distal augmentation maneuvers.  COMPARISON:  None.  FINDINGS: Thrombus within deep veins:  None visualized.  Compressibility of deep veins:  Normal.  Duplex waveform respiratory phasicity:  Normal.  Duplex waveform response to augmentation:  Normal.  Venous reflux:  None visualized.  The peroneal vein is not well visualized.  Other findings: Subcutaneous edema is noted in the mid to lower calf region and ankle.   IMPRESSION: 1. No evidence of right lower extremity deep venous thrombosis. 2. Subcutaneous edema of the right lower extremity at the level of the calf and ankle.   Electronically Signed   By: Britta MccreedySusan  Turner M.D.   On: 01/31/2014 16:55   Dg Chest Port 1 View  02/09/2014   CLINICAL DATA:  Hypoxemia.  EXAM: PORTABLE CHEST - 1 VIEW  COMPARISON:  January 31, 2014.  FINDINGS: Stable cardiomediastinal silhouette. No pneumothorax or pleural effusion is noted. Bony thorax is intact. No acute pulmonary disease is noted.  IMPRESSION: No acute cardiopulmonary abnormality seen.   Electronically Signed   By: Roque LiasJames  Green M.D.   On: 02/09/2014 14:08   Dg Foot 2 Views Left  02/08/2014   CLINICAL DATA:  Left foot pain.  EXAM: LEFT FOOT - 2 VIEW  COMPARISON:  None.  FINDINGS: Arthritic changes in the left first MTP joint with joint space narrowing. Probable erosions. Overlying soft tissue swelling.  No fracture, subluxation or dislocation. Normal bone mineralization.  IMPRESSION: Arthritic changes in the left first MTP joint with joint space narrowing and erosions with overlying soft tissue swelling. Suspect gout.   Electronically Signed   By: Charlett NoseKevin  Dover M.D.   On: 02/08/2014 22:51   Dg Foot 2 Views Right  02/09/2014   CLINICAL DATA:  Larey SeatFell 1 week ago with avulsion fracture arising from the lateral malleolus of the ankle. Right foot pain and swelling.  EXAM: RIGHT FOOT - 2 VIEW  COMPARISON:  None.  FINDINGS: No evidence of acute or subacute fracture. Osseous demineralization. Subchondral cysts involving the head of the first metatarsal. Joint space narrowing involving the IP joints of multiple toes. Mild dorsal soft tissue swelling.  IMPRESSION: No acute or subacute osseous abnormality.  Mild osteoarthritis.   Electronically Signed   By: Hulan Saashomas  Lawrence M.D.   On: 02/09/2014 21:22         Subjective: Patient remains pleasantly confused although more red than yesterday. Patient denies any headache, chest pain,  shortness breath, nausea, vomiting, diarrhea, abdominal pain. No dysuria.  Objective: Filed Vitals:   02/11/14 1643 02/11/14 2100 02/12/14 0639 02/12/14 1000  BP: 167/60 175/79 172/66 148/70  Pulse: 90 87 93 86  Temp: 98.6 F (37 C)  98.9 F (37.2 C) 98.6 F (37 C)  TempSrc:   Oral Oral  Resp: 16 18 18 18   Height:      Weight:  81.6 kg (179 lb 14.3 oz)    SpO2: 97% 97% 95% 96%    Intake/Output Summary (Last 24 hours) at 02/12/14 1532 Last data filed at 02/12/14 1100  Gross per 24 hour  Intake 2321.25 ml  Output    550 ml  Net 1771.25 ml   Weight change: -0.274 kg (-9.7 oz) Exam:   General:  Pt is alert, follows commands appropriately, not in acute distress  HEENT: No icterus, No thrush, No neck mass, [place]/AT  Cardiovascular: RRR, S1/S2, no rubs, no gallops  Respiratory: CTA bilaterally, no wheezing, no crackles, no rhonchi  Abdomen: Soft/+BS, non tender, non distended, no guarding  Extremities: trace LE edema, No lymphangitis, No petechiae, No rashes, no synovitis  Data Reviewed: Basic Metabolic Panel:  Recent Labs Lab 02/08/14 1850 02/09/14 [id number] 02/10/14 1615 02/11/14 0825 02/12/14 [id number]  NA 142 140 139 140 140  K 4.9 4.7 4.5 4.4 4.7  CL 102 102 104 105 108  CO2 26 24 20 21 19   GLUCOSE 121* 121* 156* 137* 133*  BUN 26* 25* 24* 21 18  CREATININE 1.73* 1.76* 1.46* 1.45* 1.43*  CALCIUM 10.9* 10.8* 10.1 10.2 9.1  MG 1.8  --   --   --   --   PHOS 2.7  --   --   --   --    Liver Function Tests:  Recent Labs Lab 02/08/14 1850 02/09/14 0332 02/10/14 1615 02/11/14 0825  AST 33 26 21 16   ALT 43* 34 27 23  ALKPHOS 148* 128* 124* 122*  BILITOT 0.3 0.3 0.3 0.3  PROT 8.3 7.1 7.2 7.3  ALBUMIN 3.3* 2.9* 2.9* 2.9*   No results found for this basename: LIPASE, AMYLASE,  in the last 168 hours  Recent Labs Lab 02/09/14 1915  AMMONIA 38   CBC:  Recent Labs Lab 02/08/14 1850 02/09/14 0332 02/11/14 0825  WBC 10.8* 9.2 11.9*  HGB 9.6* 8.7* 9.4*  HCT  29.2* 26.7* 29.3*  MCV 90.7 91.4 91.0  PLT 482* 462* 461*   Cardiac Enzymes: No results found for this basename: CKTOTAL, CKMB, CKMBINDEX, TROPONINI,  in the last 168 hours BNP: No components found with this basename: POCBNP,  CBG:  Recent Labs Lab 02/11/14 1336 02/11/14 1644 02/11/14 2001 02/12/14 0918 02/12/14 1132  GLUCAP 124* 123* 120* 127* 174*    Recent Results (from the past 240 hour(s))  URINE CULTURE     Status: None   Collection Time    02/10/14  4:58 PM      Result Value Ref Range Status   Specimen Description URINE, CATHETERIZED   Final   Special Requests NONE   Final   Culture  Setup Time     Final   Value: 02/10/2014 22:50     Performed at [facility] Count     Final   Value: NO GROWTH     Performed at [facility]   Culture     Final   Value: NO GROWTH     Performed at [facility]   Report Status 02/11/2014 FINAL   Final     Scheduled Meds: . amLODipine  10 mg Oral Daily  . atorvastatin  20 mg Oral QHS  . calcitonin  300 Units Subcutaneous Q12H  . enoxaparin  30 mg Subcutaneous Q24H  . hydrALAZINE  50 mg Oral 3 times per day  . insulin aspart  0-9 Units Subcutaneous TID WC  . metoprolol tartrate  25 mg Oral BID  . pantoprazole  40 mg Oral Daily  . sodium bicarbonate  650 mg Oral BID  . sodium chloride  3 mL Intravenous Q12H   Continuous Infusions: . sodium chloride Stopped (02/09/14 1256)  . sodium chloride 100 mL/hr at 02/12/14 0758     Karen Renfrew, DO  [facility] Pager [phone]  If 7PM-7AM, please contact night-coverage [email or web address] Password [facility] 02/12/2014, 3:32 PM   LOS: 4 days

## 2014-02-12 NOTE — Evaluation (Signed)
Physical Therapy Evaluation Patient Details Name: Karen Mcknight MRN: [id number] DOB: 25-Aug-1941 Today's Date: 02/12/2014 Time: [id number] PT Time Calculation (min): 38 min  PT Assessment / Plan / Recommendation History of Present Illness  Patient with PMH of DM, HTN and CKD; presented to PCP's office with a two-week history of worsening confusion expressive aphasia. [place] recently [place] family after sustaining a fall at home 2 weeks ago with LOC ~ 4 hours.  Has also had systemic weakness and debility. Patient was seen at [facility] a week ago and diagnosed with renal insufficiency, cellulitis, fall and minor head injury.  MRI brain-->T2 hyperintensities L>R parieto-occipital lobe, neg for acute infarct; scattered microhemorrhages with [facility]  Clinical Impression  Patient presents with decreased independence with mobility due to deficits listed below in problem list.  Patient will benefit from skilled PT in the acute setting to allow return home after SNF rehab stay.    PT Assessment  Patient needs continued PT services    Follow Up Recommendations  SNF    Does the patient have the potential to tolerate intense rehabilitation    N/A  Barriers to Discharge Decreased caregiver support lives alone in [place], son reports plan for her to return there    Equipment Recommendations  None recommended by PT    Recommendations for Other Services   None  Frequency Min 3X/week    Precautions / Restrictions Precautions Precautions: Fall Required Braces or Orthoses: Other Brace/Splint Other Brace/Splint: camboot on right Restrictions Weight Bearing Restrictions: Yes RLE Weight Bearing: Weight bearing as tolerated   Pertinent Vitals/Pain No pain compliants      Mobility  Bed Mobility Overal bed mobility: Needs Assistance Bed Mobility: Supine to Sit Supine to sit: Min assist General bed mobility comments: slow and weak Transfers Overall  transfer level: Needs assistance Equipment used: Rolling walker (2 wheeled) Transfers: Sit to/from Stand Sit to Stand: Mod assist;Min assist General transfer comment: min assist from bed, mod from toilet Ambulation/Gait Ambulation/Gait assistance: Min assist Ambulation Distance (Feet): 15 Feet (x 2) Assistive device: Rolling walker (2 wheeled) Gait Pattern/deviations: Step-to pattern;Trunk flexed;Decreased stride length;Shuffle General Gait Details: decreased foot clearance with camboot functional leg length discrepancy so shorter step on left, assist for balance, sequence, walker proximity and moving walker; max cues and assist for turning and backing up to toilet and chair    Exercises     PT Diagnosis: Abnormality of gait;Difficulty walking;Generalized weakness  PT Problem List: Decreased strength;Decreased activity tolerance;Decreased balance;Decreased mobility;Decreased safety awareness PT Treatment Interventions: Gait training;DME instruction;Functional mobility training;Therapeutic activities;Patient/family education;Balance training;Therapeutic exercise     PT Goals(Current goals can be found in the care plan section) Acute Rehab PT Goals Patient Stated Goal: To return home PT Goal Formulation: With patient/family Time For Goal Achievement: 02/26/14 Potential to Achieve Goals: Fair  Visit Information  Last PT Received On: 02/12/14 Assistance Needed: +1 History of Present Illness: Patient with PMH of DM, HTN and CKD; presented to PCP's office with a two-week history of worsening confusion expressive aphasia. Patient [place] recently moved [place] family after sustaining a fall at home 2 weeks ago with LOC ~ 4 hours.  Has also had systemic weakness and debility. Patient was seen at [facility] a week ago and diagnosed with renal insufficiency, cellulitis, fall and minor head injury.  MRI brain-->T2 hyperintensities L>R parieto-occipital lobe, neg for acute  infarct; scattered microhemorrhages with evidence of prior  SAH       Prior Functioning  Home Living Family/patient expects to be discharged to:: Skilled nursing facility Living Arrangements: Alone Additional Comments: from [place] where she lives alone, has been here with son due to illness recently Prior Function Comments: Patient lived alone prior to hospitalization in [place] due to fall.  Was in SNF there prior to son bringing her here for medical consultation. Communication Communication: No difficulties    Cognition  Cognition Arousal/Alertness: Awake/alert Behavior During Therapy: WFL for tasks assessed/performed Overall Cognitive Status: Impaired/Different from baseline Area of Impairment: Orientation;Problem solving Orientation Level: Place;Time;Situation Problem Solving: Slow processing;Requires verbal cues;Requires tactile cues    Extremity/Trunk Assessment Lower Extremity Assessment Lower Extremity Assessment: RLE deficits/detail;LLE deficits/detail RLE Deficits / Details: AROM WFL except ankle NT due to cam boot; strength 3+/5 RLE: Unable to fully assess due to immobilization LLE Deficits / Details: AROM WFL, strength 3+/5   Balance Balance Overall balance assessment: History of Falls;Needs assistance Standing balance support: Bilateral upper extremity supported Standing balance-Leahy Scale: Poor Standing balance comment: relies on UE assist for balance  End of Session PT - End of Session Equipment Utilized During Treatment: Gait belt Activity Tolerance: Patient limited by fatigue Patient left: in chair;with call bell/phone within reach Nurse Communication: Other (comment) (chair pad in chair, but no alarm available)  GP     [facility] 02/12/2014, 11:59 AM Sheran Lawless, PT [phone] 02/12/2014

## 2014-02-13 LAB — BASIC METABOLIC PANEL
BUN: 24 mg/dL — ABNORMAL HIGH (ref 6–23)
CO2: 18 mEq/L — ABNORMAL LOW (ref 19–32)
Calcium: 8.9 mg/dL (ref 8.4–10.5)
Chloride: 107 mEq/L (ref 96–112)
Creatinine, Ser: 1.45 mg/dL — ABNORMAL HIGH (ref 0.50–1.10)
GFR calc Af Amer: 40 mL/min — ABNORMAL LOW (ref >90)
GFR calc non Af Amer: 35 mL/min — ABNORMAL LOW (ref >90)
Glucose, Bld: 225 mg/dL — ABNORMAL HIGH (ref 70–99)
Potassium: 4.2 mEq/L (ref 3.7–5.3)
Sodium: 138 mEq/L (ref 137–147)

## 2014-02-13 LAB — GLUCOSE, CAPILLARY
Glucose-Capillary: 200 mg/dL — ABNORMAL HIGH (ref 70–99)
Glucose-Capillary: 141 mg/dL — ABNORMAL HIGH (ref 70–99)
Glucose-Capillary: 134 mg/dL — ABNORMAL HIGH (ref 70–99)

## 2014-02-13 LAB — PROTEIN ELECTROPHORESIS, SERUM
ALBUMIN ELP: 43.8 % — AB (ref 55.8–66.1)
Alpha-1-Globulin: 7.4 % — ABNORMAL HIGH (ref 2.9–4.9)
Alpha-2-Globulin: 15.7 % — ABNORMAL HIGH (ref 7.1–11.8)
BETA 2: 7 % — AB (ref 3.2–6.5)
Beta Globulin: 6.2 % (ref 4.7–7.2)
Gamma Globulin: 19.9 % — ABNORMAL HIGH (ref 11.1–18.8)
M-Spike, %: NOT DETECTED g/dL
Total Protein ELP: 5.8 g/dL — ABNORMAL LOW (ref 6.0–8.3)

## 2014-02-13 LAB — CALCIUM, IONIZED: Calcium, Ion: 1.34 mmol/L — ABNORMAL HIGH (ref 1.13–1.30)

## 2014-02-13 LAB — VITAMIN D 25 HYDROXY (VIT D DEFICIENCY, FRACTURES): Vit D, 25-Hydroxy: 33 ng/mL (ref 30–89)

## 2014-02-13 MED ORDER — SITAGLIPTIN PHOSPHATE 50 MG PO TABS: 50.0000 mg | ORAL_TABLET | Freq: Every day | ORAL | Status: AC

## 2014-02-13 MED ORDER — AMLODIPINE BESYLATE 10 MG PO TABS: 10.0000 mg | ORAL_TABLET | Freq: Every day | ORAL | Status: AC

## 2014-02-13 MED ORDER — HYDRALAZINE HCL 50 MG PO TABS: 50.0000 mg | ORAL_TABLET | Freq: Three times a day (TID) | ORAL | Status: AC

## 2014-02-13 MED ORDER — METOPROLOL TARTRATE 25 MG PO TABS: 25.0000 mg | ORAL_TABLET | Freq: Two times a day (BID) | ORAL | Status: AC

## 2014-02-13 MED ORDER — GLUCERNA SHAKE PO LIQD: 237.0000 mL | Freq: Three times a day (TID) | ORAL | Status: AC

## 2014-02-13 NOTE — Progress Notes (Signed)
11:40-Foley catheter removed, per MD order. Encouraging patient to take in po fluids. Will monitor for urine output. 15:30-Patient has had no urine output since removal of foley catheter. PO fluid intake 240cc and IV fluids since removal have totaled 350cc. Patient's abdomen is soft, not distended, and patient denies discomfort. Report has been given to nurse taking over care of patient at this time, and she is aware of order to in and out cath patient if patient is unable to void.

## 2014-02-13 NOTE — Progress Notes (Signed)
Pt was bladder scanned after not voiding for 5 hours once foley catheter removed. 230cc were noted on the scan . Pt was I&O cath and 100cc was emptied from her bladder. MD was notified. No new orders received.

## 2014-02-13 NOTE — Progress Notes (Signed)
Pt discharged to SNF with d/c summary and after visit summary. Pt was transported by ambulance along with all of her belongings. Laverda SorensonATJANA Aerik Polan, RN

## 2014-02-13 NOTE — Discharge Summary (Signed)
Physician Discharge Summary  Karen Mcknight ZOX:[id number] DOB: [birth year] DOA: 02/08/2014  PCP: Piedad Climes, PA-C  Admit date: 02/08/2014 Discharge date: 02/13/2014  Recommendations for Outpatient Follow-up:  1. Pt will need to follow up with PCP in 2 weeks post discharge 2. Please obtain BMP to evaluate electrolytes and kidney function 3. 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 -MRI findings are discussed below -Blood pressure gradually improving with adjustment of antihypertensive regimen -Mental status improving with treatment of blood pressure Hypercalcemia  - Most likely primary hyperparathyroidism--can be addressed as an outpatient  -Corrected calcium 11.68  -Intact PTH-136.6  -Check urine calcium/creatinine ratio--pending  -IV fluids--increase to 100cc/hr  -received calcitonin [place] x 4 doses  -TSH--0.792  -check 1,25 vitamin D levels and vitamin A level-pending -25-vitamin D level--33  Globulin Gap  -Check SPEP and UPEP--pending  Diabetes mellitus type 2  -Hemoglobin A1c 6.8  -d/c levemir for now as pt has very little po intake  -continue novolog sliding scale  -Patient will be discharged on Januvia 50 mg daily Acute on Chronic Renal Failure (CKD 3)  -improving with IVF--plateau at 1.4 range  -IV fluids  -unclear baseline, but it appears now serum creatinine plateau  ~1.4  bilateral foot pain//Avulsion fracture of Right ankle  -02/02/2014 x-ray of the right ankle reveals a small avulsion fracture lateral malleolus  -X-ray left foot negative for fracture  -Repeat x-ray right ankle--small avulsion fx of lateral malleolus  -spoke with Dr. Magnus Ivan (ortho)--no surgery necessary--WBAT  -CAM boot if pt can tolerate  Hypertension  -Continue amlodipine 10mg   -Increase hydralazine to 50 mg every 8 hours  -add metoprolol tartrate 25mg  bid   Deconditioning  -PT evaluation-->SNF  Family Communication: Updated son at bedside  Disposition Plan: SNF   Discharge Condition: Stable  Disposition:  skilled nursing facility  Diet: Carbohydrate modified Wt Readings from Last 3 Encounters:  02/11/14 81.6 kg (179 lb 14.3 oz)  02/08/14 83.008 kg (183 lb)  02/02/14 83.008 kg (183 lb)    History of present illness:  73 year old female with a history of Type II DM, Stage 3 CKD, HTN, HLD and metabolic encephalopathy presented with acute encephalopathy. The patient had a mechanical fall approximately 2 weeks ago home.   Patient's family is concerned because patient's mental status has changed tremendously since her fall. Patient has had difficulty with speech and with understanding. Has also had systemic weakness and debility. Patient was seen at [facility] ER a week ago and diagnosed with renal insufficiency, cellulitis, fall and minor head injury. Patient's CT at that time was negative for acute cerebral event. Family states patient is sometime hard to rouse to alertness. State patient is unable to respond sometimes, although she was completely coherent a few weeks ago. Workup in the hospital including MRI of the brain suggested Reversible posterior leukoencephalopathy syndrome (PRES). It was negative for acute infarction. The patient was also noted to be hypercalcemic. Her initial corrected calcium was 11.68. The patient was started on intravenous fluids. She also received  subcutaneous Miacalcin. Her  calcium improved. With treatment of her left leg derangement as well as her blood pressure the patient's encephalopathy gradually improved. However the patient is not yet back to baseline. Multiple discussions were undertaken with the family. It was explained that this would be a gradual process. Further workup of her hypercalcemia suggested primary hyperparathyroidism. However, multiple labs including vitamin A, 1, 25 vitamin D, SPEP, and UPEP are pending at this time. The patient was also noted to haveavulsion fracture of her right lateral malleolus. Orthopedics was consulted over the phone. They recommended a CAM boot and WBAT. The patient's oral intake gradually improved. Her hemoglobin A1c was 6.8. The patient will be discharged with Januvia 50 mg daily.     Discharge Exam: Filed Vitals:   02/13/14 1226  BP: 134/68  Pulse: 66  Temp: 98.5 F (36.9 C)  Resp: 17   Filed Vitals:   02/12/14 2058 02/13/14 0449 02/13/14 0852 02/13/14 1226  BP: 144/108 156/68 167/61 134/68  Pulse: 71 71 69 66  Temp: 99 F (37.2 C) 98.4 F (36.9 C) 98.5 F (36.9 C) 98.5 F (36.9 C)  TempSrc: Oral Oral Oral Oral  Resp: 18 18 16 17   Height:      Weight:      SpO2: 95% 91% 92% 92%   General: A&O x 2, NAD, pleasant, cooperative Cardiovascular: RRR, no rub, no gallop, no S3 Respiratory: CTAB, no wheeze, no rhonchi Abdomen:soft, nontender, nondistended, positive bowel sounds Extremities: No edema, No lymphangitis, no petechiae  Discharge Instructions      Discharge Orders   Future Orders Complete By Expires   Diet Carb Modified  As directed    Increase activity slowly  As directed        Medication List    STOP taking these medications       clindamycin 300 MG capsule  Commonly known as:  CLEOCIN     insulin detemir 100 UNIT/ML injection  Commonly known as:  LEVEMIR     meclizine 25 MG tablet  Commonly known as:  ANTIVERT     pioglitazone 45 MG tablet   Commonly known as:  ACTOS     primidone 50 MG tablet  Commonly known as:  MYSOLINE      TAKE these medications       amLODipine 10 MG tablet  Commonly known as:  NORVASC  Take 1 tablet (10 mg total) by mouth daily.     feeding supplement (GLUCERNA SHAKE) Liqd  Take 237 mLs by mouth 3 (three) times daily between meals.     ferrous sulfate 325 (65 FE) MG tablet  Take 325 mg by mouth 2 (two) times daily with a meal.     hydrALAZINE 50 MG tablet  Commonly known as:  APRESOLINE  Take 1 tablet (50 mg total) by mouth every 8 (eight) hours.     metoprolol tartrate 25 MG tablet  Commonly known as:  LOPRESSOR  Take 1 tablet (25 mg total) by mouth 2 (two) times daily.     omeprazole 40 MG capsule  Commonly known as:  PRILOSEC  Take 40 mg by mouth daily.     oxyCODONE-acetaminophen 5-325 MG per tablet  Commonly known as:  PERCOCET/ROXICET  Take 1 tablet by mouth every 6 (six) hours as needed for severe pain.     simvastatin 40 MG tablet  Commonly known as:  ZOCOR  Take 40 mg by mouth daily.     sitaGLIPtin 50 MG tablet  Commonly known as:  JANUVIA  Take 1 tablet (50 mg total) by mouth daily.     sodium bicarbonate 650 MG tablet  Take 650 mg by mouth 2 (two) times daily.     UNABLE TO FIND  Ms. Myrene Bougher is currently hospitalized at [facility] in [place], [place] under my care.  She was admitted on 02/08/14.         The results of significant diagnostics from this hospitalization (including imaging, microbiology, ancillary and laboratory) are listed below for reference.    Significant Diagnostic Studies: Dg Chest 2 View  01/31/2014   CLINICAL DATA:  Bilateral foot swelling and leg swelling for 6 days, fell 2 weeks ago  EXAM: CHEST  2 VIEW  COMPARISON:  None.  FINDINGS: Heart size is upper normal. Vascular pattern is normal. Lungs are clear. No effusion or or pneumothorax. Bony thorax intact.  IMPRESSION: No acute findings   Electronically Signed   By: Esperanza Heir M.D.   On: 01/31/2014 17:08   Dg Wrist Complete Left  01/31/2014   CLINICAL DATA:  Larey Seat 2 weeks ago with wrist pain  EXAM: LEFT WRIST - COMPLETE 3+ VIEW  COMPARISON:  None.  FINDINGS: Old fracture ulnar styloid process, corticated and mildly displaced. Triangular fibrocartilage calcification noted. No evidence of acute fracture or dislocation.  IMPRESSION: No acute findings   Electronically Signed   By: Esperanza Heir M.D.   On: 01/31/2014 17:07   Dg Ankle Complete Right  02/09/2014   CLINICAL DATA:  Avulsion fracture involving the tip of the lateral malleolus.  EXAM: RIGHT ANKLE - COMPLETE 3+ VIEW  COMPARISON:  Right ankle x-rays 1 week ago on 02/02/2014.  FINDINGS: Small avulsion fracture involving the tip of the lateral malleolus as noted previously. No new fractures. Lateral soft tissue swelling. Ankle mortise intact with well preserved joint space. Osseous demineralization. No visible joint effusion.  IMPRESSION: Small avulsion fracture involving the tip of the lateral malleolus as noted previously. No new abnormalities.   Electronically Signed   By: Hulan Saas M.D.   On: 02/09/2014 21:20   Dg Ankle Complete Right  02/02/2014   CLINICAL DATA:  Fall pain swelling  EXAM: RIGHT ANKLE - COMPLETE 3+ VIEW  COMPARISON:  None.  FINDINGS: Significant diffuse soft tissue swelling. Tiny bony fragment off of the tip of the lateral malleolus. The mortise is intact.  IMPRESSION: Significant soft tissue swelling consistent with sprain. There also appears to be a tiny avulsion fracture off of the tip of the lateral malleolus.   Electronically Signed   By: Esperanza Heir M.D.   On: 02/02/2014 12:41   Ct Head Wo Contrast  01/31/2014   CLINICAL DATA:  Persistent headaches after falling 2 weeks ago.  EXAM: CT HEAD WITHOUT CONTRAST  TECHNIQUE: Contiguous axial images were obtained from the base of the skull through the vertex without intravenous contrast.  COMPARISON:  None.  FINDINGS: There is no  evidence of acute intracranial hemorrhage, mass lesion, brain edema or extra-axial fluid collection. The ventricles and subarachnoid spaces are appropriately sized for age. There is no CT evidence of acute cortical infarction. Mild intracranial vascular calcifications are noted.  The visualized paranasal sinuses, mastoid air cells and middle ears are clear. The calvarium is intact.  IMPRESSION: Unremarkable noncontrast head CT for age.   Electronically Signed   By: Roxy Horseman M.D.   On: 01/31/2014 17:10   Mr Brain Wo Contrast  02/09/2014   CLINICAL DATA:  Confusion.  Altered mental status.  EXAM: MRI HEAD WITHOUT CONTRAST  TECHNIQUE: Multiplanar, multiecho pulse sequences of the brain and surrounding structures were obtained without intravenous contrast.  COMPARISON:  Head CT 01/31/2014  FINDINGS: There is no acute infarct. Susceptibility artifact is present in multiple left greater than right parietal, posterior left frontal, and bilateral occipital lobe sulci consistent with prior subarachnoid hemorrhage. There are also scattered, small foci of microhemorrhage in cortical and subcortical locations within both cerebral hemispheres, predominantly posteriorly, and within the cerebellum. There are patchy periventricular white matter T2 hyperintensities. There are also patchy T2 hyperintensities within the left greater than right parietal and occipital lobes which appear largely subcortical but with some cortical involvement as well. Small subcortical T2 hyperintensity is also present in the left frontal lobe near the vertex. There is no evidence of mass, midline shift, or extra-axial fluid collection.  Marrow signal in the clivus is slightly heterogeneous, nonspecific. Major intracranial vascular flow voids are unremarkable. Cerebral volume is within normal limits for age. Prior bilateral cataract surgery is noted. Paranasal sinuses and mastoid air cells are clear.  IMPRESSION: 1. No evidence of acute infarct. 2.  Patchy T2 hyperintensities predominantly involving the left greater than right parietal and occipital lobes. This is nonspecific, with considerations including PRES and sequelae of prior ischemia. 3. Scattered foci of microhemorrhage within the left greater than right cerebral hemispheres and to a lesser extent cerebellum, predominantly posteriorly. Microhemorrhages can be seen with PRES as well as cerebral amyloid angiopathy and prior infarcts. 4. 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   Electronically Signed   By: Sebastian Ache   On: 02/09/2014 09:28   US Venous Img Lower Unilateral Right  01/31/2014   CLINICAL DATA:  Right lower leg swelling. Fell 6 days ago. Patient unable to provide months history at this time.  EXAM: RIGHT LOWER EXTREMITY VENOUS DOPPLER ULTRASOUND  TECHNIQUE: Gray-scale sonography with graded compression, as well as color Doppler and duplex ultrasound, were performed to evaluate the deep venous system from the level of the common femoral vein through the popliteal and proximal calf veins. Spectral Doppler was utilized to evaluate flow at rest and with distal augmentation maneuvers.  COMPARISON:  None.  FINDINGS: Thrombus within deep veins:  None visualized.  Compressibility of deep veins:  Normal.  Duplex waveform respiratory phasicity:  Normal.  Duplex waveform response to augmentation:  Normal.  Venous reflux:  None visualized.  The peroneal vein is not well visualized.  Other findings: Subcutaneous edema is noted in the mid to lower calf region and ankle.  IMPRESSION: 1. No evidence of right lower extremity deep venous thrombosis. 2. Subcutaneous edema of the right lower extremity at the level of the calf and ankle.   Electronically Signed   By: Britta Mccreedy M.D.   On: 01/31/2014 16:55   Dg Chest Port 1 View  02/09/2014   CLINICAL DATA:  Hypoxemia.  EXAM: PORTABLE CHEST - 1 VIEW  COMPARISON:  January 31, 2014.  FINDINGS: Stable cardiomediastinal  silhouette. No pneumothorax or pleural effusion is noted. Bony thorax is intact. No acute pulmonary disease is noted.  IMPRESSION: No acute cardiopulmonary abnormality seen.   Electronically Signed   By: Roque Lias M.D.   On: 02/09/2014 14:08   Dg Foot 2 Views Left  02/08/2014   CLINICAL DATA:  Left foot pain.  EXAM: LEFT FOOT - 2 VIEW  COMPARISON:  None.  FINDINGS: Arthritic changes in the left first MTP joint with joint space narrowing. Probable erosions.  Overlying soft tissue swelling.  No fracture, subluxation or dislocation. Normal bone mineralization.  IMPRESSION: Arthritic changes in the left first MTP joint with joint space narrowing and erosions with overlying soft tissue swelling. Suspect gout.   Electronically Signed   By: Charlett NoseKevin  Dover M.D.   On: 02/08/2014 22:51   Dg Foot 2 Views Right  02/09/2014   CLINICAL DATA:  Larey SeatFell 1 week ago with avulsion fracture arising from the lateral malleolus of the ankle. Right foot pain and swelling.  EXAM: RIGHT FOOT - 2 VIEW  COMPARISON:  None.  FINDINGS: No evidence of acute or subacute fracture. Osseous demineralization. Subchondral cysts involving the head of the first metatarsal. Joint space narrowing involving the IP joints of multiple toes. Mild dorsal soft tissue swelling.  IMPRESSION: No acute or subacute osseous abnormality.  Mild osteoarthritis.   Electronically Signed   By: Hulan Saashomas  Lawrence M.D.   On: 02/09/2014 21:22     Microbiology: Recent Results (from the past 240 hour(s))  URINE CULTURE     Status: None   Collection Time    02/10/14  4:58 PM      Result Value Ref Range Status   Specimen Description URINE, CATHETERIZED   Final   Special Requests NONE   Final   Culture  Setup Time     Final   Value: 02/10/2014 22:50     Performed at [facility]   Colony Count     Final   Value: NO GROWTH     [facility]formed at [facility]   Culture     Final   Value: NO GROWTH     Performed at [facility]   Report Status  02/11/2014 FINAL   Final     Labs: Basic Metabolic Panel:  Recent Labs Lab 02/08/14 1850 02/09/14 0332 02/10/14 1615 02/11/14 0825 02/12/14 0705 02/13/14 0555  NA 142 140 139 140 140 138  K 4.9 4.7 4.5 4.4 4.7 4.2  CL 102 102 104 105 108 107  CO2 26 24 20 21 19  18*  GLUCOSE 121* 121* 156* 137* 133* 225*  BUN 26* 25* 24* 21 18 24*  CREATININE 1.73* 1.76* 1.46* 1.45* 1.43* 1.45*  CALCIUM 10.9* 10.8* 10.1 10.2 9.1 8.9  MG 1.8  --   --   --   --   --   PHOS 2.7  --   --   --   --   --    Liver Function Tests:  Recent Labs Lab 02/08/14 1850 02/09/14 0332 02/10/14 1615 02/11/14 0825  AST 33 26 21 16   ALT 43* 34 27 23  ALKPHOS 148* 128* 124* 122*  BILITOT 0.3 0.3 0.3 0.3  PROT 8.3 7.1 7.2 7.3  ALBUMIN 3.3* 2.9* 2.9* 2.9*   No results found for this basename: LIPASE, AMYLASE,  in the last 168 hours  Recent Labs Lab 02/09/14 1915  AMMONIA 38   CBC:  Recent Labs Lab 02/08/14 1850 02/09/14 0332 02/11/14 0825  WBC 10.8* 9.2 11.9*  HGB 9.6* 8.7* 9.4*  HCT 29.2* 26.7* 29.3*  MCV 90.7 91.4 91.0  PLT 482* 462* 461*   Cardiac Enzymes: No results found for this basename: CKTOTAL, CKMB, CKMBINDEX, TROPONINI,  in the last 168 hours BNP: No components found with this basename: POCBNP,  CBG:  Recent Labs Lab 02/12/14 1132 02/12/14 1642 02/12/14 2205 02/13/14 0850 02/13/14 1218  GLUCAP 174* 113* 131* 200* 141*    Time coordinating discharge:  Greater than 30 minutes  Signed:  Manraj Yeo, DO [facility] Pager: [phone] 02/13/2014, 1:05 PM

## 2014-02-14 LAB — UIFE/LIGHT CHAINS/TP QN, 24-HR UR
ALPHA 2 UR: DETECTED — AB
Albumin, U: DETECTED
Alpha 1, Urine: DETECTED — AB
BETA UR: DETECTED — AB
FREE KAPPA/LAMBDA RATIO: 9.06 ratio (ref 2.04–10.37)
Free Kappa Lt Chains,Ur: 15.5 mg/dL — ABNORMAL HIGH (ref 0.14–2.42)
Free Lambda Lt Chains,Ur: 1.71 mg/dL — ABNORMAL HIGH (ref 0.02–0.67)
Gamma Globulin, Urine: DETECTED — AB
Total Protein, Urine: 85.3 mg/dL

## 2014-02-14 LAB — CALCIUM / CREATININE RATIO, URINE: Creatinine, Urine: 179.3 mg/dL

## 2014-02-16 LAB — VITAMIN D 1,25 DIHYDROXY
VITAMIN D 1, 25 (OH) TOTAL: 55 pg/mL (ref 18–72)
Vitamin D2 1, 25 (OH)2: 29 pg/mL
Vitamin D3 1, 25 (OH)2: 26 pg/mL

## 2014-02-18 ENCOUNTER — Encounter: Payer: Self-pay | Admitting: Family Medicine

## 2014-02-18 ENCOUNTER — Telehealth: Payer: Self-pay | Admitting: Family Medicine

## 2014-02-18 DIAGNOSIS — S6992XA Unspecified injury of left wrist, hand and finger(s), initial encounter: Secondary | ICD-10-CM | POA: Insufficient documentation

## 2014-02-18 DIAGNOSIS — S99911A Unspecified injury of right ankle, initial encounter: Secondary | ICD-10-CM | POA: Insufficient documentation

## 2014-02-18 DIAGNOSIS — S299XXA Unspecified injury of thorax, initial encounter: Secondary | ICD-10-CM | POA: Insufficient documentation

## 2014-02-18 NOTE — Assessment & Plan Note (Signed)
radiographs negative.  Consistent with sprain.  Very little pain at this point - will hold off on providing wrist brace unless  This worsens.

## 2014-02-18 NOTE — Progress Notes (Signed)
Patient ID: Karen Mcknight, female   DOB: 05-04-1941, 73 y.o.   MRN: [id number]  PCP: Piedad ClimesMartin, William Cody, PA-C  Subjective:   HPI: Patient is a 73 y.o. female here for fall.  There was some confusion regarding today's appointment. Family was under the impression she was here to establish with me as a primary care physician since she's moved down here with family following hospitalization in [place]. Advised this is to follow-up on injuries sustained in her fall. They report she was walking from bedroom to bathroom around 3am on 1/31 when she lost balance, fell into tub. Injured left ribs, right ankle, mid back. She was admitted in [place] though only records they have are medication report from SNF dated 2/7. She came to ED here and had head CT, chest x-ray and left wrist x-rays that were negative. Most pain is left ribs. They're unsure if she has osteoporosis. Was diagnosed with cellulitis per their report as well and took antibiotics - has had some improvement in this.  Past Medical History  Diagnosis Date  . Gout   . Hypertension   . Tremors of nervous system   . Chicken pox   . Chronic kidney disease (CKD), stage IV (severe)   . Type II diabetes mellitus   . High cholesterol   . GERD (gastroesophageal reflux disease)   . Anemia   . History of blood transfusion   . Dementia     "dx'd today" (02/08/2014)  . Arthritis     "?knee" (02/08/2014)  . Aphasia 02/08/2014    Hattie Perch/notes 02/08/2014    Current Outpatient Prescriptions on File Prior to Visit  Medication Sig Dispense Refill  . ferrous sulfate 325 (65 FE) MG tablet Take 325 mg by mouth 2 (two) times daily with a meal.      . omeprazole (PRILOSEC) 40 MG capsule Take 40 mg by mouth daily.      . simvastatin (ZOCOR) 40 MG tablet Take 40 mg by mouth daily.      . sodium bicarbonate 650 MG tablet Take 650 mg by mouth 2 (two) times daily.       No current facility-administered medications on file prior to visit.    Past Surgical  History  Procedure Laterality Date  . Cholecystectomy    . Appendectomy      No Known Allergies  History   Social History  . Marital Status: Divorced    Spouse Name: N/A    Number of Children: N/A  . Years of Education: N/A   Occupational History  . Not on file.   Social History Main Topics  . Smoking status: Former Smoker    Types: Cigarettes  . Smokeless tobacco: Never Used     Comment: 02/08/2014 "quit smoking > 20 yr ago"  . Alcohol Use: Yes     Comment: 02/08/2014 "may have an occasional glass of wine"  . Drug Use: No  . Sexual Activity: No   Other Topics Concern  . Not on file   Social History Narrative  . No narrative on file    Family History  Problem Relation Age of Onset  . Diabetes Mother 5483    Deceased  . Alzheimer's disease Mother   . Hypertension Mother   . Diabetes Brother     x2  . Cancer Sister   . Diabetes Sister   . Hypertension Sister   . Hyperlipidemia Son   . Hypertension Son     #2  . Heart disease Son     #  2    BP 183/82  Pulse 68  Ht 5\' 4"  (1.626 m)  Wt 183 lb (83.008 kg)  BMI 31.40 kg/m2  Review of Systems: See HPI above.    Objective:  Physical Exam:  Gen: NAD  Left wrist: No gross deformity, swelling, bruising. No focal TTP over snuffbox, radius, ulna.  Mild tenderness at area of wrist joint dorsally. FROM with pain on full flexion and extension. NVI distally.  Chest: CTAB without wheezes, rales, rhonchi.  Lung sounds audible, normal throughout all fields. Tenderness to palpation left lateral chest wall at areas of 6th, 7th ribs.  R ankle: Mod swelling throughout.  No erythema, ecchymoses. Very limited ROM all directions. TTP greatest laterally over ATFL, lateral malleolus though has mild ant ankle joint tenderness. 1+ ant drawer and talar tilt. Pain with syndesmotic compression. Thompsons test negative. NV intact distally.    Assessment & Plan:  1. Rib injury - radiographs negative.  Consistent with  contusion - reassured patient and family.  Percocet as needed for severe pain.  May take up to 8 weeks to completely recover.  Icing as needed.  2. Left wrist injury - radiographs negative.  Consistent with sprain.  Very little pain at this point - will hold off on providing wrist brace unless  This worsens.    3. Right ankle injury - if patient had cellulitis it appears healed and is s/p antibiotics.  Radiographs today show a probable small avulsion fracture lateral malleolus.  Treatment similar to that of an ankle sprain.  Icing, elevation, compression.  Easy ROM exercises with plan to reevaluate in 2 weeks.  Addendum:  Records received from [facility] in [place].  CT head showed only minor chronic microvascular disease (date 01/20/14).  CT chest did show a subtle left sixth rib fracture - believe this was related to accident though no pneumothorax, treatment would not change unless patient would like a rib belt.  MRI head without acute findings.  Chest radiographs with rib films were negative (only seen on CT).  EEG was consistent with a mild-moderate diffuse encephalopathy.  Notes from 12/25/13 indicate she was seen then for redness/swelling of right foot - thought to have gout and put on colcrys, hydrocodone.  Radiographs were negative of foot for fracture.  Will scan into the chart.

## 2014-02-18 NOTE — Assessment & Plan Note (Signed)
if patient had cellulitis it appears healed and is s/p antibiotics.  Radiographs today show a probable small avulsion fracture lateral malleolus.  Treatment similar to that of an ankle sprain.  Icing, elevation, compression.  Easy ROM exercises with plan to reevaluate in 2 weeks.

## 2014-02-18 NOTE — Assessment & Plan Note (Signed)
radiographs negative.  Consistent with contusion - reassured patient and family.  Percocet as needed for severe pain.  May take up to 8 weeks to completely recover.  Icing as needed.

## 2014-02-18 NOTE — Telephone Encounter (Signed)
Opened in error

## 2014-02-19 ENCOUNTER — Telehealth: Payer: Self-pay | Admitting: *Deleted

## 2014-02-19 NOTE — Telephone Encounter (Signed)
May we call the rehabilitation facility to see if they have a process by which they can arrange transport for the patient to have her follow-up appointment.  If not, can we find out the expected discharge date?

## 2014-02-19 NOTE — Telephone Encounter (Signed)
Expected at least 4 more weeks of Rehab therapy; please advise/SLS

## 2014-02-19 NOTE — Telephone Encounter (Signed)
If there is a provider at the rehabilitation facility, or if we can request order for CBC and BMP, then I am ok with her following up at discharge.  If this cannot be done then we need to arrange for her to be seen in clinic within 2 weeks to reassess patient and for repeat labs.

## 2014-02-19 NOTE — Telephone Encounter (Signed)
Spoke with son at home number, reports that patient in is transition Rehab therapy at [facility] in [place]: [address] @ [phone]; unsure of how long her stay will be there/SLS Please advise further on scheduling appt.

## 2014-02-19 NOTE — Telephone Encounter (Signed)
Message Call patient Received: Today     Karen ClimesWilliam Cody Martin, PA-C Regis BillSharon L Scates, CMA    Please call patient and her family to get them to schedule a hospital follow-up. She was recently discharged with instruction to F/U in 2 weeks but no appointment has been made. This is important because we need to repeat labs to make sure all lab values have improved and to verify there are no new concerns from her family.

## 2014-02-20 NOTE — Telephone Encounter (Addendum)
Son is requesting to speak to nurse, he is requesting labs to be done here. Wants everything to go through our office. Does not trust rehab.  Spoke to ArroyoBelinda, NP at [facility] [pt's rehab facility]; labs have already been drawn on patient and she will fax over results/SLS

## 2014-02-23 ENCOUNTER — Other Ambulatory Visit: Payer: Self-pay | Admitting: Physician Assistant

## 2014-02-23 DIAGNOSIS — R778 Other specified abnormalities of plasma proteins: Secondary | ICD-10-CM

## 2014-03-02 ENCOUNTER — Telehealth: Payer: Self-pay | Admitting: *Deleted

## 2014-03-30 NOTE — Telephone Encounter (Signed)
Result Note     We need to call [facility] so that we can have the records from her stay. Patient needs to be seen for a follow-up after discharge. This is very important. Patient had diagnosis of PRES when she was seen at [facility]. Please inform patient's son that we are aware of this diagnosis.   Calcium / creatinine ratio, urine   Status: Final result Visible to patient: This result is not viewable by the patient. Next appt: None      Notes Recorded by Regis BillSharon L Scates, CMA on 03/16/2014 at 5:16 PM Daughter-in-law in office for visit; reports patient still in ICU in [facility] per provider/SLS Notes Recorded by Regis BillSharon L Scates, CMA on 03/05/2014 at 2:33 PM Spoke with Fenton FoyLynn Shultis, daughter-in-law; pt [facility] at [facility] with no indication of timeframe for discharge. Patient's family will inform our office when they are aware of discharge date, so we may request hospital records for stay and also scheduled f/u OV/SLS Notes Recorded by Regis BillSharon L Scates, CMA on 03/02/2014 at 12:10 PM Patient Unavailable; spoke with son, Juliene PinaGeoffrey & daughter-in-law, Fenton FoyLynn Bobier [HIPAA] to inform of lab results, as pt has been in Rehab therapy facility post hospital. Informed by family that pt is now in ICU in [facility] since Sunday with diagnosis of PRES, HTN related TIAs & Seizures and that pt is lethargic. They believe symptoms of UTI also started progression of symptoms that lead to pt's present position, which is now cleared up and Stage III kidney disease with stable kidney function. Family said PCP could contact attending physician Dr. Ellyn HackPeter Brath at [phone] ext: [phone]/SLS  Notes Recorded by Piedad ClimesWilliam Cody Martin, PA-C on 02/23/2014 at 11:14 AM We need to set patient up to see Hematology/Oncology once she is discharged from the Rehabilitation Facility. She had some abnormal proteins in her blood and urine and I need her to see a specialist to make sure  there is no concern for cancerous process of her blood. I need to make sure she returns to clinic for a hospital follow-up as soon as she is discharged as well.

## 2014-05-10 NOTE — Telephone Encounter (Signed)
Patient was moved back to [place] per son/SLS

## 2015-04-21 DEATH — deceased

## 2015-05-26 IMAGING — CR DG CHEST 2V
2 series · 2 of 2 positions shown · non-contrast
Comparison: None.

CLINICAL DATA: Bilateral foot swelling and leg swelling for 6 days,
fell 2 weeks ago

EXAM:
CHEST  2 VIEW

[w chest ap]
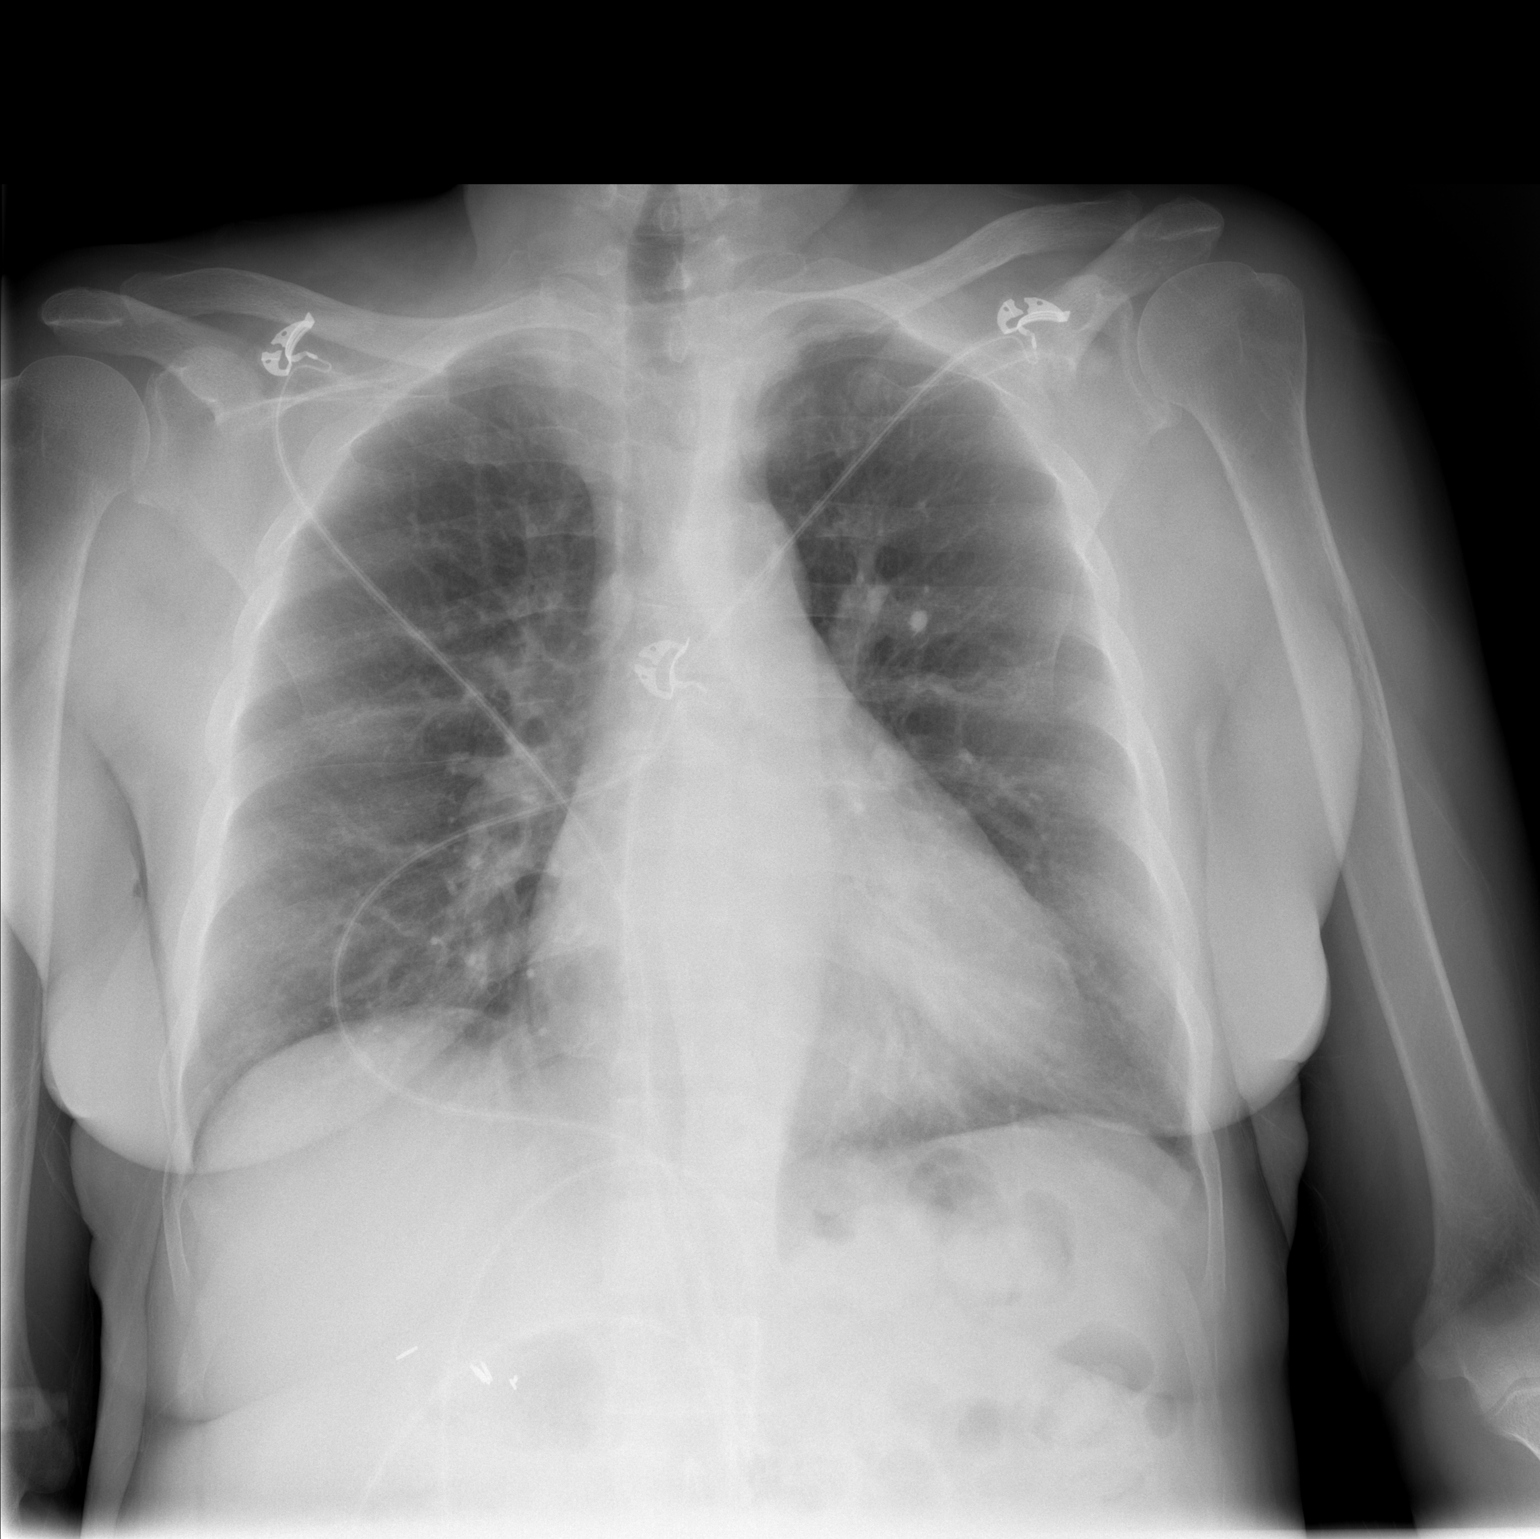

[w chest lat]
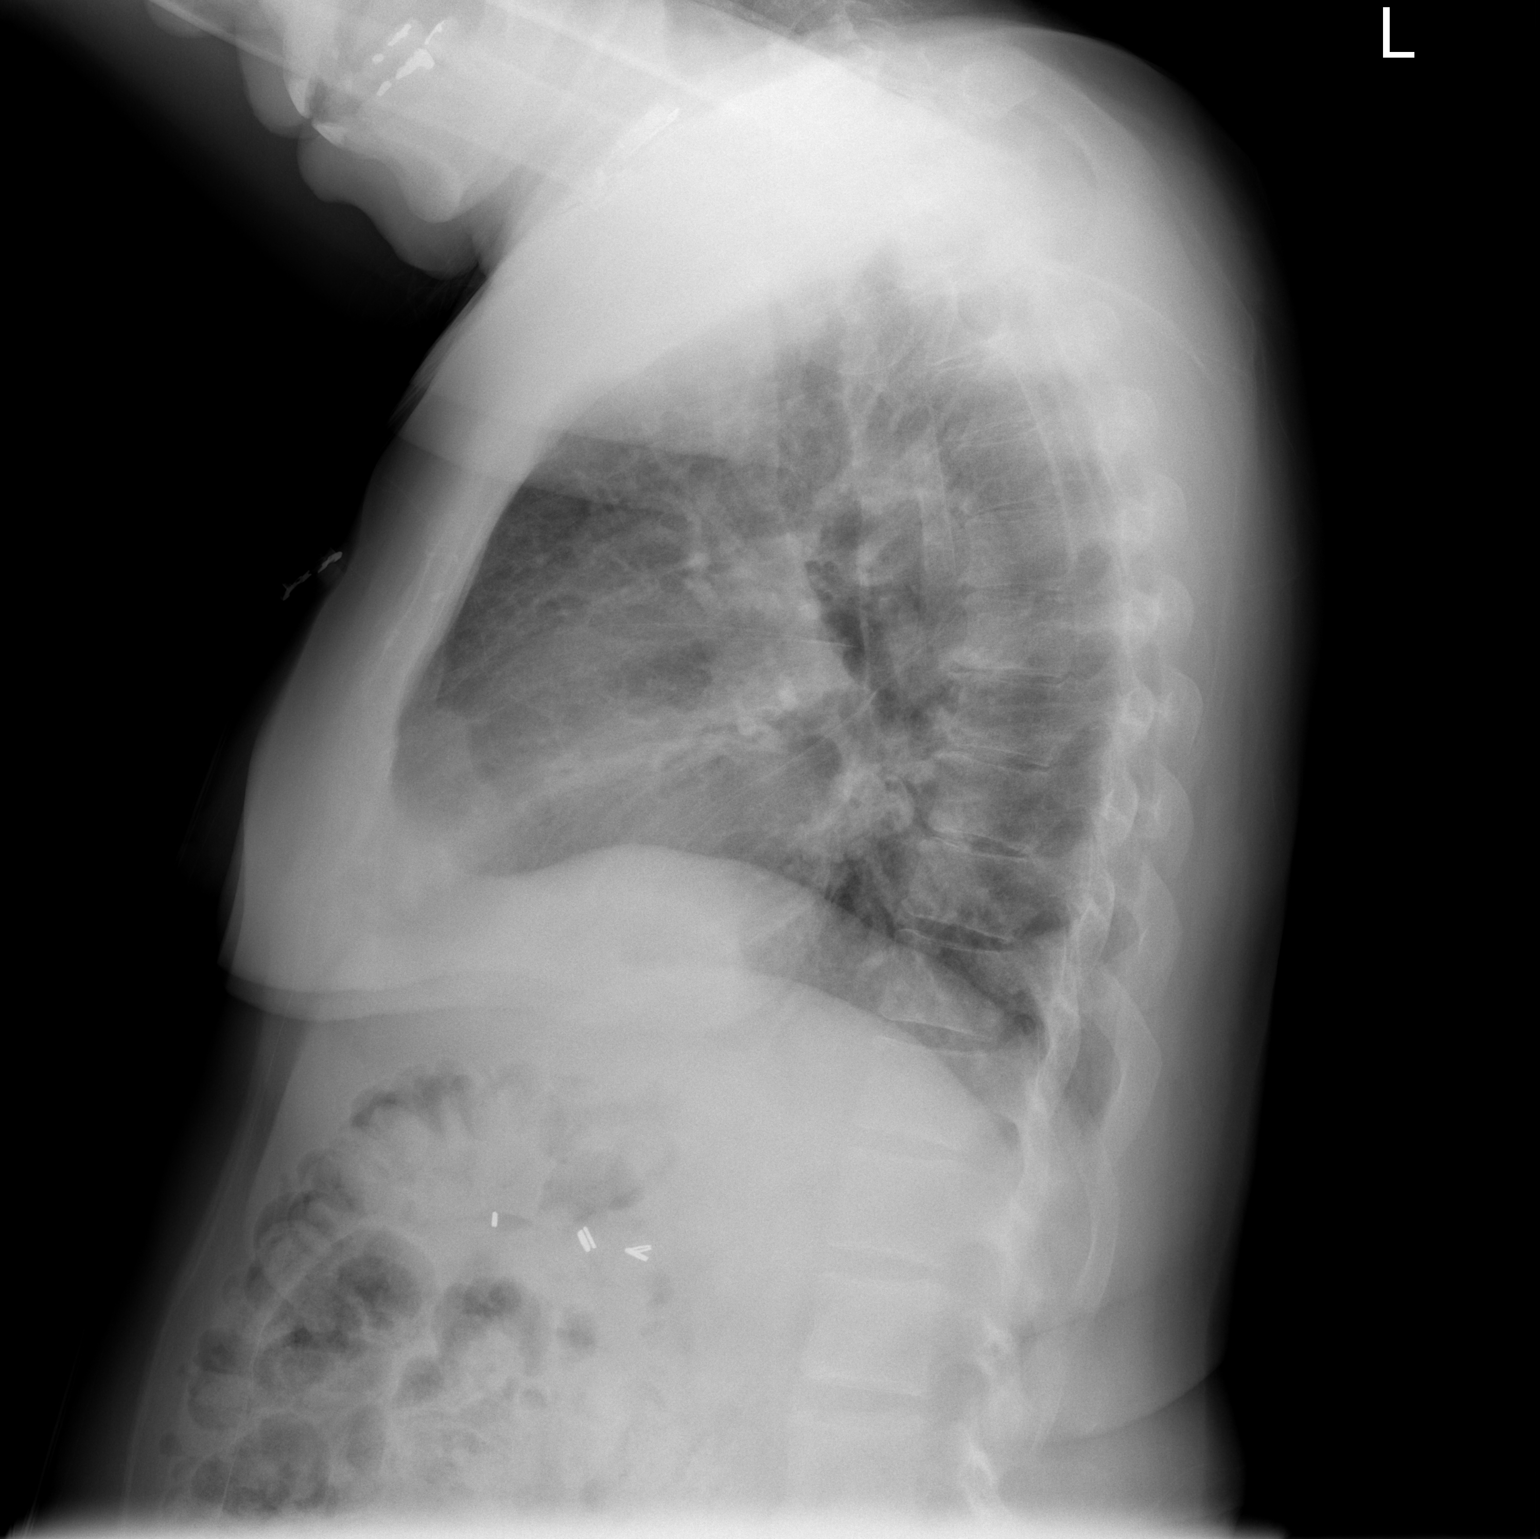

[2 of 2 positions shown; findings below may reference images not displayed]

FINDINGS: Heart size is upper normal. Vascular pattern is normal. Lungs are
clear. No effusion or or pneumothorax. Bony thorax intact.
IMPRESSION: No acute findings

## 2015-05-26 IMAGING — CR DG WRIST COMPLETE 3+V*L*
4 series · 4 of 4 positions shown · non-contrast
Comparison: None.

CLINICAL DATA: Fell 2 weeks ago with wrist pain

EXAM:
LEFT WRIST - COMPLETE 3+ VIEW

[x wrist pa left]
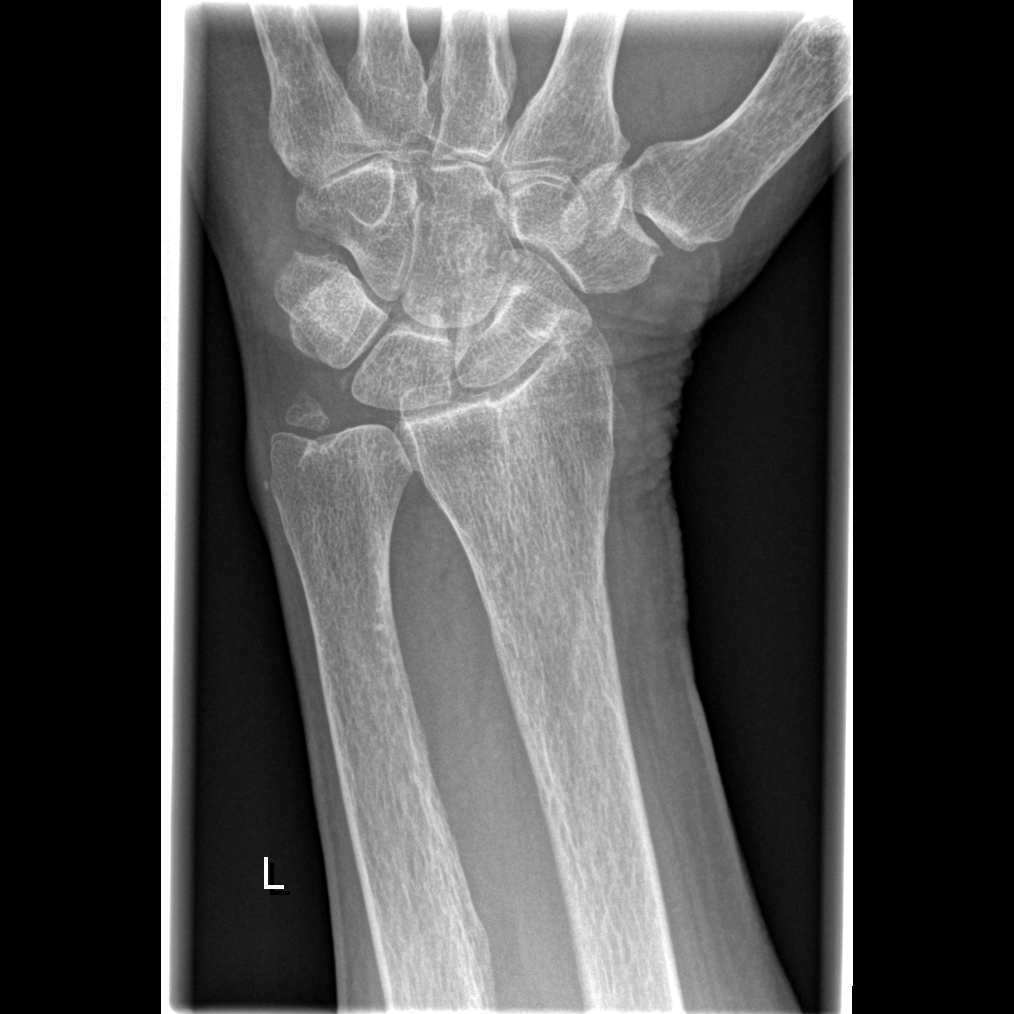

[x wrist obl left]
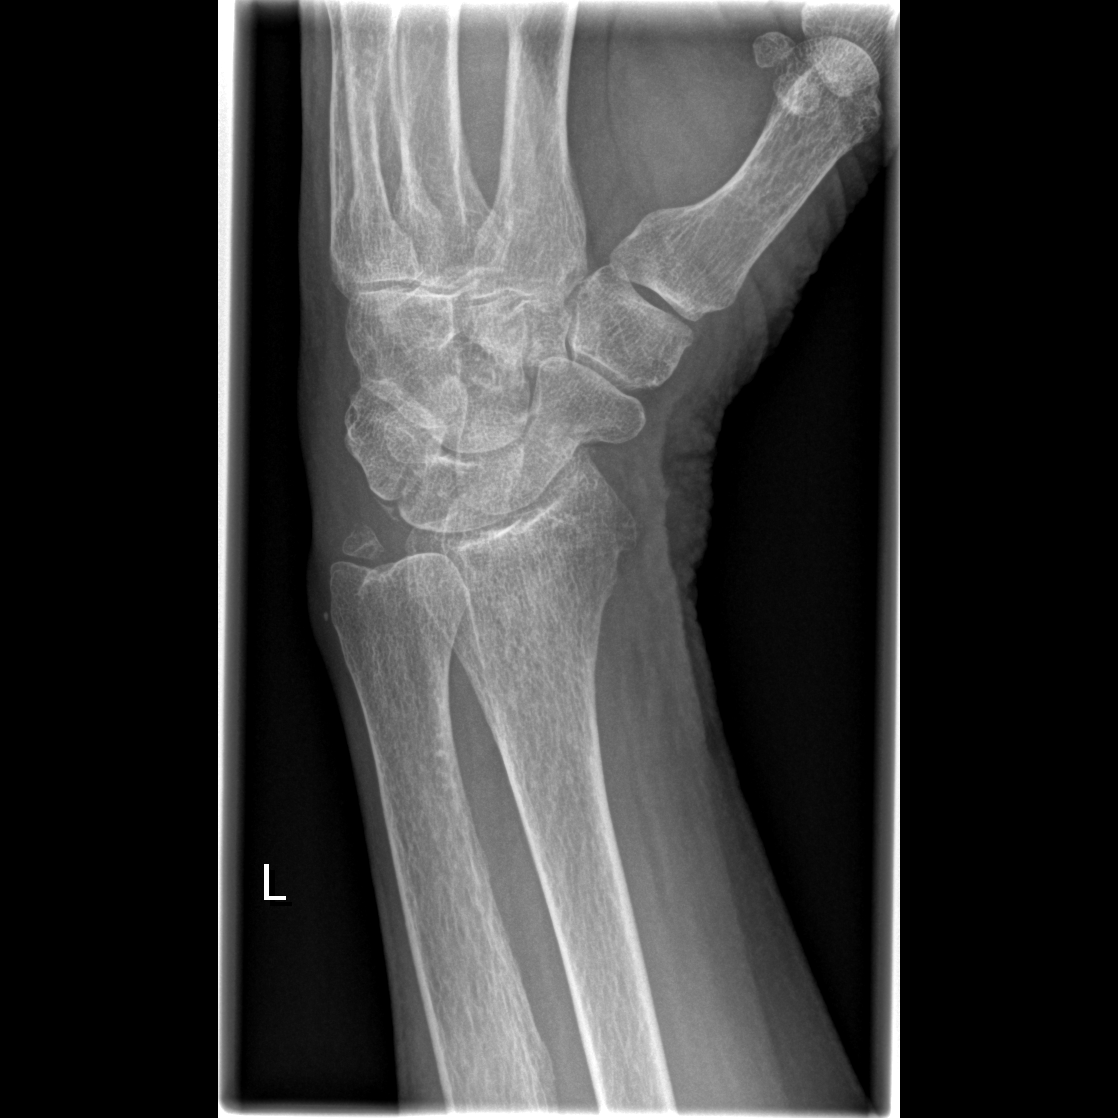

[x wrist lat left]
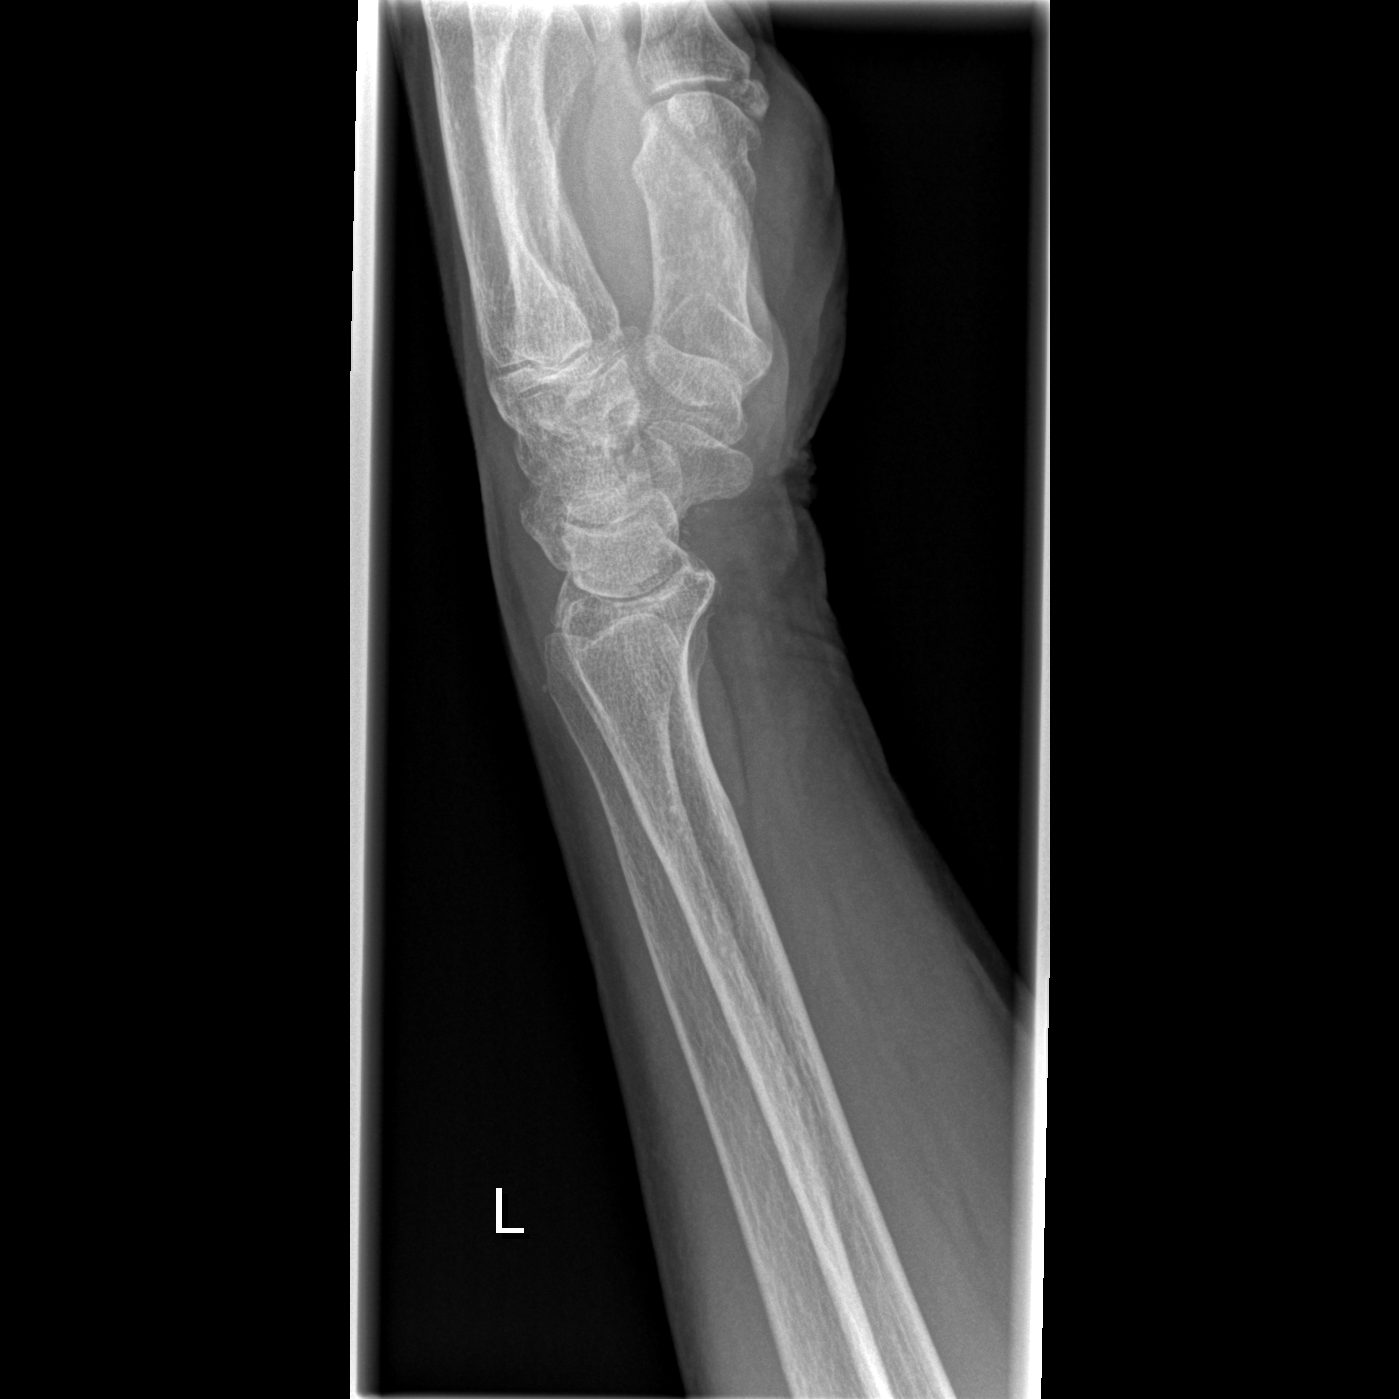

[x navicular]
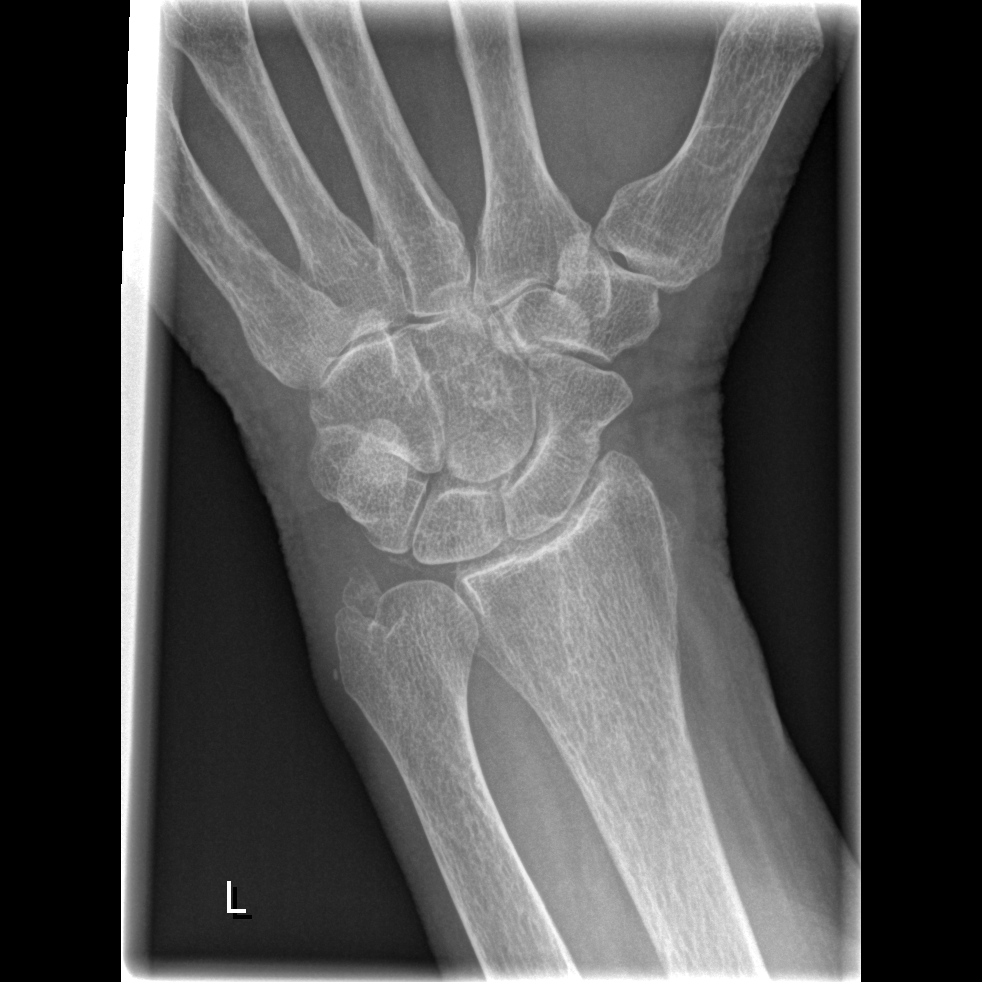

[4 of 4 positions shown; findings below may reference images not displayed]

FINDINGS: Old fracture ulnar styloid process, corticated and mildly displaced.
Triangular fibrocartilage calcification noted. No evidence of acute
fracture or dislocation.
IMPRESSION: No acute findings
